# Patient Record
Sex: Male | Born: 1955 | Race: White | Hispanic: No | Marital: Single | Smoking: Current every day smoker
Health system: Southern US, Community
[De-identification: ages and names within clinical notes are randomized; demographics above are authoritative.]

## PROBLEM LIST (undated history)

## (undated) DIAGNOSIS — K529 Noninfective gastroenteritis and colitis, unspecified: Secondary | ICD-10-CM

## (undated) DIAGNOSIS — I10 Essential (primary) hypertension: Secondary | ICD-10-CM

## (undated) DIAGNOSIS — B1920 Unspecified viral hepatitis C without hepatic coma: Secondary | ICD-10-CM

## (undated) DIAGNOSIS — G8929 Other chronic pain: Secondary | ICD-10-CM

## (undated) DIAGNOSIS — C801 Malignant (primary) neoplasm, unspecified: Secondary | ICD-10-CM

## (undated) DIAGNOSIS — M549 Dorsalgia, unspecified: Secondary | ICD-10-CM

## (undated) DIAGNOSIS — G2581 Restless legs syndrome: Secondary | ICD-10-CM

## (undated) HISTORY — DX: Restless legs syndrome: G25.81

## (undated) HISTORY — PX: LAMINECTOMY: SHX219

## (undated) HISTORY — PX: LUMBAR DISC SURGERY: SHX700

## (undated) HISTORY — DX: Other chronic pain: G89.29

## (undated) HISTORY — PX: ROTATOR CUFF REPAIR: SHX139

## (undated) HISTORY — PX: KNEE SURGERY: SHX244

## (undated) HISTORY — DX: Dorsalgia, unspecified: M54.9

## (undated) HISTORY — DX: Noninfective gastroenteritis and colitis, unspecified: K52.9

---

## 2001-10-22 ENCOUNTER — Emergency Department (HOSPITAL_COMMUNITY): Admission: EM | Admit: 2001-10-22 | Discharge: 2001-10-23 | Payer: Self-pay | Admitting: Emergency Medicine

## 2002-11-27 ENCOUNTER — Emergency Department (HOSPITAL_COMMUNITY): Admission: EM | Admit: 2002-11-27 | Discharge: 2002-11-27 | Payer: Self-pay | Admitting: Emergency Medicine

## 2002-11-27 ENCOUNTER — Encounter: Payer: Self-pay | Admitting: Emergency Medicine

## 2002-12-19 ENCOUNTER — Emergency Department (HOSPITAL_COMMUNITY): Admission: EM | Admit: 2002-12-19 | Discharge: 2002-12-19 | Payer: Self-pay | Admitting: Emergency Medicine

## 2003-09-02 ENCOUNTER — Emergency Department (HOSPITAL_COMMUNITY): Admission: EM | Admit: 2003-09-02 | Discharge: 2003-09-02 | Payer: Self-pay | Admitting: *Deleted

## 2003-11-04 ENCOUNTER — Emergency Department (HOSPITAL_COMMUNITY): Admission: EM | Admit: 2003-11-04 | Discharge: 2003-11-04 | Payer: Self-pay | Admitting: Emergency Medicine

## 2004-02-16 ENCOUNTER — Emergency Department (HOSPITAL_COMMUNITY): Admission: EM | Admit: 2004-02-16 | Discharge: 2004-02-16 | Payer: Self-pay | Admitting: Emergency Medicine

## 2004-08-06 ENCOUNTER — Emergency Department (HOSPITAL_COMMUNITY): Admission: EM | Admit: 2004-08-06 | Discharge: 2004-08-06 | Payer: Self-pay | Admitting: Emergency Medicine

## 2004-10-05 ENCOUNTER — Ambulatory Visit (HOSPITAL_COMMUNITY): Admission: RE | Admit: 2004-10-05 | Discharge: 2004-10-05 | Payer: Self-pay | Admitting: Neurosurgery

## 2005-04-15 ENCOUNTER — Ambulatory Visit (HOSPITAL_COMMUNITY): Admission: RE | Admit: 2005-04-15 | Discharge: 2005-04-15 | Payer: Self-pay | Admitting: Neurosurgery

## 2005-06-26 ENCOUNTER — Emergency Department (HOSPITAL_COMMUNITY): Admission: EM | Admit: 2005-06-26 | Discharge: 2005-06-26 | Payer: Self-pay | Admitting: Emergency Medicine

## 2005-08-05 ENCOUNTER — Emergency Department (HOSPITAL_COMMUNITY): Admission: EM | Admit: 2005-08-05 | Discharge: 2005-08-05 | Payer: Self-pay | Admitting: Emergency Medicine

## 2005-08-29 ENCOUNTER — Emergency Department (HOSPITAL_COMMUNITY): Admission: EM | Admit: 2005-08-29 | Discharge: 2005-08-29 | Payer: Self-pay | Admitting: Emergency Medicine

## 2006-02-07 ENCOUNTER — Emergency Department (HOSPITAL_COMMUNITY): Admission: EM | Admit: 2006-02-07 | Discharge: 2006-02-07 | Payer: Self-pay | Admitting: Family Medicine

## 2006-02-13 ENCOUNTER — Emergency Department (HOSPITAL_COMMUNITY): Admission: EM | Admit: 2006-02-13 | Discharge: 2006-02-13 | Payer: Self-pay | Admitting: Emergency Medicine

## 2006-07-22 ENCOUNTER — Emergency Department (HOSPITAL_COMMUNITY): Admission: EM | Admit: 2006-07-22 | Discharge: 2006-07-22 | Payer: Self-pay | Admitting: Emergency Medicine

## 2006-07-29 ENCOUNTER — Ambulatory Visit (HOSPITAL_COMMUNITY): Admission: RE | Admit: 2006-07-29 | Discharge: 2006-07-29 | Payer: Self-pay | Admitting: Neurosurgery

## 2006-07-29 ENCOUNTER — Encounter: Payer: Self-pay | Admitting: Vascular Surgery

## 2006-07-29 ENCOUNTER — Emergency Department (HOSPITAL_COMMUNITY): Admission: EM | Admit: 2006-07-29 | Discharge: 2006-07-29 | Payer: Self-pay | Admitting: Emergency Medicine

## 2006-10-31 ENCOUNTER — Ambulatory Visit (HOSPITAL_COMMUNITY): Admission: RE | Admit: 2006-10-31 | Discharge: 2006-10-31 | Payer: Self-pay | Admitting: Neurosurgery

## 2006-11-21 ENCOUNTER — Emergency Department (HOSPITAL_COMMUNITY): Admission: EM | Admit: 2006-11-21 | Discharge: 2006-11-21 | Payer: Self-pay | Admitting: Emergency Medicine

## 2007-01-08 ENCOUNTER — Emergency Department (HOSPITAL_COMMUNITY): Admission: EM | Admit: 2007-01-08 | Discharge: 2007-01-08 | Payer: Self-pay | Admitting: Emergency Medicine

## 2007-08-07 ENCOUNTER — Emergency Department (HOSPITAL_COMMUNITY): Admission: EM | Admit: 2007-08-07 | Discharge: 2007-08-07 | Payer: Self-pay | Admitting: *Deleted

## 2007-08-14 ENCOUNTER — Emergency Department (HOSPITAL_COMMUNITY): Admission: EM | Admit: 2007-08-14 | Discharge: 2007-08-14 | Payer: Self-pay | Admitting: *Deleted

## 2008-07-15 ENCOUNTER — Ambulatory Visit (HOSPITAL_COMMUNITY): Admission: RE | Admit: 2008-07-15 | Discharge: 2008-07-15 | Payer: Self-pay | Admitting: Cardiovascular Disease

## 2008-07-16 ENCOUNTER — Ambulatory Visit (HOSPITAL_COMMUNITY): Admission: RE | Admit: 2008-07-16 | Discharge: 2008-07-16 | Payer: Self-pay | Admitting: Cardiovascular Disease

## 2008-07-30 ENCOUNTER — Inpatient Hospital Stay (HOSPITAL_COMMUNITY): Admission: RE | Admit: 2008-07-30 | Discharge: 2008-07-31 | Payer: Self-pay | Admitting: Neurosurgery

## 2008-08-25 ENCOUNTER — Ambulatory Visit (HOSPITAL_COMMUNITY): Admission: RE | Admit: 2008-08-25 | Discharge: 2008-08-25 | Payer: Self-pay | Admitting: Neurosurgery

## 2008-10-04 ENCOUNTER — Encounter: Admission: RE | Admit: 2008-10-04 | Discharge: 2008-10-04 | Payer: Self-pay | Admitting: Neurosurgery

## 2008-12-23 ENCOUNTER — Encounter (INDEPENDENT_AMBULATORY_CARE_PROVIDER_SITE_OTHER): Payer: Self-pay | Admitting: Emergency Medicine

## 2008-12-23 ENCOUNTER — Emergency Department (HOSPITAL_COMMUNITY): Admission: EM | Admit: 2008-12-23 | Discharge: 2008-12-23 | Payer: Self-pay | Admitting: Emergency Medicine

## 2008-12-23 ENCOUNTER — Ambulatory Visit: Payer: Self-pay | Admitting: Vascular Surgery

## 2008-12-27 ENCOUNTER — Emergency Department (HOSPITAL_COMMUNITY): Admission: EM | Admit: 2008-12-27 | Discharge: 2008-12-27 | Payer: Self-pay | Admitting: Emergency Medicine

## 2009-02-09 ENCOUNTER — Inpatient Hospital Stay (HOSPITAL_COMMUNITY): Admission: EM | Admit: 2009-02-09 | Discharge: 2009-02-17 | Payer: Self-pay | Admitting: Emergency Medicine

## 2009-03-05 ENCOUNTER — Encounter: Admission: RE | Admit: 2009-03-05 | Discharge: 2009-03-05 | Payer: Self-pay | Admitting: Orthopaedic Surgery

## 2009-04-02 ENCOUNTER — Ambulatory Visit (HOSPITAL_COMMUNITY): Admission: RE | Admit: 2009-04-02 | Discharge: 2009-04-02 | Payer: Self-pay | Admitting: Neurosurgery

## 2009-05-02 ENCOUNTER — Encounter
Admission: RE | Admit: 2009-05-02 | Discharge: 2009-07-31 | Payer: Self-pay | Admitting: Physical Medicine & Rehabilitation

## 2009-06-06 ENCOUNTER — Ambulatory Visit: Payer: Self-pay | Admitting: Physical Medicine & Rehabilitation

## 2009-06-23 ENCOUNTER — Ambulatory Visit: Payer: Self-pay | Admitting: Physical Medicine & Rehabilitation

## 2009-06-26 ENCOUNTER — Encounter: Admission: RE | Admit: 2009-06-26 | Discharge: 2009-06-26 | Payer: Self-pay | Admitting: Orthopedic Surgery

## 2009-07-11 ENCOUNTER — Ambulatory Visit: Payer: Self-pay | Admitting: Physical Medicine & Rehabilitation

## 2009-07-16 ENCOUNTER — Ambulatory Visit: Payer: Self-pay | Admitting: Vascular Surgery

## 2009-07-21 ENCOUNTER — Ambulatory Visit: Payer: Self-pay | Admitting: Internal Medicine

## 2009-07-21 DIAGNOSIS — R51 Headache: Secondary | ICD-10-CM

## 2009-07-21 DIAGNOSIS — R519 Headache, unspecified: Secondary | ICD-10-CM | POA: Insufficient documentation

## 2009-07-21 DIAGNOSIS — I10 Essential (primary) hypertension: Secondary | ICD-10-CM | POA: Insufficient documentation

## 2009-07-21 DIAGNOSIS — M545 Low back pain: Secondary | ICD-10-CM | POA: Insufficient documentation

## 2009-07-31 ENCOUNTER — Ambulatory Visit: Payer: Self-pay | Admitting: Physical Medicine & Rehabilitation

## 2009-08-20 ENCOUNTER — Encounter: Payer: Self-pay | Admitting: Internal Medicine

## 2009-08-20 ENCOUNTER — Observation Stay (HOSPITAL_COMMUNITY): Admission: EM | Admit: 2009-08-20 | Discharge: 2009-08-20 | Payer: Self-pay | Admitting: Emergency Medicine

## 2009-08-22 ENCOUNTER — Ambulatory Visit: Payer: Self-pay | Admitting: Internal Medicine

## 2009-08-22 DIAGNOSIS — F17200 Nicotine dependence, unspecified, uncomplicated: Secondary | ICD-10-CM | POA: Insufficient documentation

## 2009-08-22 LAB — CONVERTED CEMR LAB
AST: 24 units/L (ref 0–37)
Albumin: 4.5 g/dL (ref 3.5–5.2)
BUN: 10 mg/dL (ref 6–23)
Barbiturate Quant, Ur: NEGATIVE
Basophils Relative: 0.1 % (ref 0.0–3.0)
Benzodiazepines.: NEGATIVE
Bilirubin Urine: NEGATIVE
Bilirubin, Direct: 0.1 mg/dL (ref 0.0–0.3)
Calcium: 9.9 mg/dL (ref 8.4–10.5)
Eosinophils Relative: 1.4 % (ref 0.0–5.0)
HCT: 46.6 % (ref 39.0–52.0)
Hemoglobin: 15.3 g/dL (ref 13.0–17.0)
Ketones, ur: NEGATIVE mg/dL
Leukocytes, UA: NEGATIVE
Lymphs Abs: 2.7 10*3/uL (ref 0.7–4.0)
MCV: 98.5 fL (ref 78.0–100.0)
Marijuana Metabolite: NEGATIVE
Methadone: NEGATIVE
Monocytes Absolute: 0.6 10*3/uL (ref 0.1–1.0)
Monocytes Relative: 8.3 % (ref 3.0–12.0)
Neutro Abs: 3.8 10*3/uL (ref 1.4–7.7)
Platelets: 237 10*3/uL (ref 150.0–400.0)
Potassium: 4.3 meq/L (ref 3.5–5.3)
Propoxyphene: NEGATIVE
RBC: 4.73 M/uL (ref 4.22–5.81)
Sodium: 139 meq/L (ref 135–145)
Specific Gravity, Urine: 1.02 (ref 1.000–1.030)
Total Bilirubin: 0.4 mg/dL (ref 0.3–1.2)
Total Protein, Urine: NEGATIVE mg/dL
Urine Glucose: NEGATIVE mg/dL
WBC: 7.2 10*3/uL (ref 4.5–10.5)
pH: 5 (ref 5.0–8.0)

## 2009-08-25 ENCOUNTER — Telehealth: Payer: Self-pay | Admitting: Internal Medicine

## 2009-08-26 ENCOUNTER — Telehealth: Payer: Self-pay | Admitting: Internal Medicine

## 2009-08-28 ENCOUNTER — Ambulatory Visit: Payer: Self-pay | Admitting: Internal Medicine

## 2009-08-29 ENCOUNTER — Telehealth: Payer: Self-pay | Admitting: Internal Medicine

## 2009-09-01 ENCOUNTER — Telehealth: Payer: Self-pay | Admitting: Internal Medicine

## 2009-09-11 ENCOUNTER — Ambulatory Visit (HOSPITAL_COMMUNITY): Admission: RE | Admit: 2009-09-11 | Discharge: 2009-09-11 | Payer: Self-pay | Admitting: Orthopedic Surgery

## 2009-09-23 ENCOUNTER — Encounter: Admission: RE | Admit: 2009-09-23 | Discharge: 2009-09-23 | Payer: Self-pay | Admitting: Orthopedic Surgery

## 2009-10-09 ENCOUNTER — Ambulatory Visit (HOSPITAL_COMMUNITY): Admission: RE | Admit: 2009-10-09 | Discharge: 2009-10-09 | Payer: Self-pay | Admitting: Orthopedic Surgery

## 2009-10-09 ENCOUNTER — Encounter (INDEPENDENT_AMBULATORY_CARE_PROVIDER_SITE_OTHER): Payer: Self-pay | Admitting: Orthopedic Surgery

## 2009-10-09 ENCOUNTER — Ambulatory Visit: Payer: Self-pay | Admitting: Vascular Surgery

## 2009-11-12 ENCOUNTER — Ambulatory Visit (HOSPITAL_COMMUNITY): Admission: RE | Admit: 2009-11-12 | Discharge: 2009-11-12 | Payer: Self-pay | Admitting: Orthopedic Surgery

## 2009-12-31 ENCOUNTER — Ambulatory Visit (HOSPITAL_COMMUNITY): Admission: RE | Admit: 2009-12-31 | Discharge: 2009-12-31 | Payer: Self-pay | Admitting: Orthopedic Surgery

## 2010-01-23 ENCOUNTER — Emergency Department (HOSPITAL_COMMUNITY): Admission: EM | Admit: 2010-01-23 | Discharge: 2010-01-23 | Payer: Self-pay | Admitting: Emergency Medicine

## 2010-03-26 ENCOUNTER — Ambulatory Visit (HOSPITAL_COMMUNITY): Admission: RE | Admit: 2010-03-26 | Discharge: 2010-03-26 | Payer: Self-pay | Admitting: Orthopedic Surgery

## 2010-08-30 ENCOUNTER — Encounter: Payer: Self-pay | Admitting: Neurosurgery

## 2010-08-31 ENCOUNTER — Encounter: Payer: Self-pay | Admitting: Neurosurgery

## 2010-09-08 NOTE — Progress Notes (Signed)
Summary: Pain rx  Phone Note Call from Patient Call back at Home Phone [phone]   Summary of Call: Patient left message on triage that he needs prescription for pain. Per the patient he is waiting on call back from Dr. Vear Clock regarding a device for his back, but doesn't have any idea when he will get it. Per the patient he cannot afford to keep going to the hopital (already owes a great deal of money to them). Please advise. Initial call taken by: Lucious Groves,  August 29, 2009 11:47 AM  Follow-up for Phone Call        no more narcotics, that was only short term, wait to hear from Dr. Vear Clock Follow-up by: Etta Grandchild MD,  August 29, 2009 4:45 PM  Additional Follow-up for Phone Call Additional follow up Details #1::        Patient notified and gave him the # to call that MD. Additional Follow-up by: Lucious Groves,  August 29, 2009 4:49 PM

## 2010-09-08 NOTE — Progress Notes (Signed)
Summary: refill request hydrocodone  Phone Note Refill Request Message from:  Fax from Pharmacy on August 26, 2009 10:56 AM  Refills Requested: Medication #1:  LORTAB 7.5 7.5-500 MG TABS One by mouth three times a day for pain   Dosage confirmed as above?Dosage Confirmed   Supply Requested: 1 month   Last Refilled: 08/22/2009   Notes: given #25  Is this ok to refill for pt?  Initial call taken by: Rock Nephew CMA,  August 26, 2009 10:57 AM  Follow-up for Phone Call        no Follow-up by: Etta Grandchild MD,  August 26, 2009 10:59 AM  Additional Follow-up for Phone Call Additional follow up Details #1::        Pt informed, he has office visit tomorrow to discuss tomorrow Additional Follow-up by: Lamar Sprinkles, CMA,  August 26, 2009 4:23 PM

## 2010-09-08 NOTE — Progress Notes (Signed)
Summary: PAIN REFERRAL  Phone Note Call from Patient Call back at Home Phone [phone]   Summary of Call: Patient is requesting a referral. At last office visit pt was told to call [facility] and pt says they didn't want to see him. He would like to be referred to someone regarding the pain in his legs.  Initial call taken by: Lamar Sprinkles, CMA,  August 25, 2009 11:59 AM  Follow-up for Phone Call        done Follow-up by: Etta Grandchild MD,  August 25, 2009 12:02 PM

## 2010-09-08 NOTE — Assessment & Plan Note (Signed)
Summary: elev bp/offered sda, requested 1/14/cd   Vital Signs:  Patient profile:   55 year old male Height:      72 inches O2 Sat:      98 % on Room air Temp:     97.3 degrees F oral Pulse rate:   79 / minute Pulse rhythm:   regular Resp:     16 per minute BP sitting:   130 / 94  (left arm) Cuff size:   large  Vitals Entered By: Rock Nephew CMA (August 22, 2009 1:53 PM)  O2 Flow:  Room air  Primary Care Provider:  Etta Grandchild MD   History of Present Illness: He returns 2 days after a fall that worsened his low back pain. He was seen in the ER and given IV and oral opioids and referred to [facility] and Dr. Despina Hick. He wants a higher dose of hydrocodone today. He also wants to start treating his high blood pressure.  Back Pain History:      The patient's back pain started approximately 08/20/2009.  The pain is located in the lower back region and does not radiate below the knees.  He states this is not work related.  On a scale of 1-10, he describes the pain as a 5.  He states that he has had a prior history of back pain.  The patient has not had any recent physical therapy for his back pain.  The following makes the back pain better: rest.  The following makes the back pain worse: movement.        Description of injury in patient's own words:  fell out of bed.    Critical Exclusionary Diagnosis Criteria (CEDC) for Back Pain:      The patient gives a history of previous trauma.  He notes a prior history of spinal surgery.  There are no symptoms to suggest infection or cauda equina.  Cancer risk factors include age >50 yrs with new back pain.  Possible psychosocial cause(s) for his back pain include failure of previous therapy, history of substance abuse, and disability compensation-[place] or pending.  Other positive CEDC factors include "failed back syndrome".    Hypertension History:      He complains of headache, but denies chest pain, palpitations, dyspnea with  exertion, orthopnea, peripheral edema, visual symptoms, neurologic problems, syncope, and side effects from treatment.        Positive major cardiovascular risk factors include male age 59 years old or older, hypertension, and Cheema tobacco user.        Further assessment for target organ damage reveals no history of ASHD, cardiac end-organ damage (CHF/LVH), stroke/TIA, peripheral vascular disease, renal insufficiency, or hypertensive retinopathy.      Preventive Screening-Counseling & Management  Alcohol-Tobacco     Alcohol drinks/day: 0     Smoking Status: Peoples     Smoking Cessation Counseling: yes     Smoke Cessation Stage: precontemplative     Packs/Day: 1.0     Year Started: 1980     Pack years: 30  Hep-HIV-STD-Contraception     Hepatitis Risk: no risk noted     HIV Risk: no risk noted     STD Risk: no risk noted      Drug Use:  no.        Blood Transfusions:  no.    [place] Medications (verified): 1)  Cymbalta 20 Mg Cpep (Duloxetine Hcl) .Marland Kitchen.. 1 Once Daily 2)  Aspirin 81 Mg Tbec (Aspirin) .Marland KitchenMarland KitchenMarland Kitchen  1 Once Daily 3)  Otc Sinus Decongestant With Tylenol .Marland Kitchen.. 1 Once Daily As Needed 4)  Methocarbamol 500 Mg Tabs (Methocarbamol) .Marland Kitchen.. 1 Three Times A Day As Needed 5)  Hydrocodone-Acetaminophen 5-500 Mg Tabs (Hydrocodone-Acetaminophen) .Marland Kitchen.. 1 Every 4-6 Hours Prn  Allergies (verified): 1)  ! Codeine 2)  ! Flexeril  Past History:  Past Medical History: Reviewed history from 07/21/2009 and no changes required. Headache Hypertension Low back pain  Past Surgical History: Reviewed history from 07/21/2009 and no changes required. Total knee replacement Lumbar laminectomy Tonsillectomy  Social History: Reviewed history from 07/21/2009 and no changes required. Single/Disabled [place]ange Smoker Alcohol use-no Drug use-no Regular exercise-no  Review of Systems  The patient denies anorexia, fever, weight loss, weight gain, abdominal pain, hematuria, incontinence, muscle  weakness, and suspicious skin lesions.    Physical Exam  General:  alert, well-developed, well-nourished, well-hydrated, and unable to place on exam table, in a wheel chair.   Head:  normocephalic and atraumatic.   Mouth:  good dentition, no gingival abnormalities, no dental plaque, pharynx pink and moist, no erythema, and no exudates.   Neck:  supple, full ROM, and no masses.   Lungs:  Normal respiratory effort, chest expands symmetrically. Lungs are clear to auscultation, no crackles or wheezes. Heart:  Normal rate and regular rhythm. S1 and S2 normal without gallop, murmur, click, rub or other extra sounds. Abdomen:  Bowel sounds positive,abdomen soft and non-tender without masses, organomegaly or hernias noted. Msk:  normal ROM, no joint tenderness, no joint swelling, no joint warmth, no redness over joints, no joint deformities, no joint instability, and no crepitation.   Pulses:  R and L carotid,radial,femoral,dorsalis pedis and posterior tibial pulses are full and equal bilaterally Extremities:  No clubbing, cyanosis, edema, or deformity noted with normal full range of motion of all joints.   Neurologic:  No cranial nerve deficits noted. Station and gait are normal. Plantar reflexes are down-going bilaterally. DTRs are symmetrical throughout. Sensory, motor and coordinative functions appear intact. Skin:  turgor normal, color normal, no rashes, no suspicious lesions, no ecchymoses, no petechiae, and tattoo(s).   Cervical Nodes:  no anterior cervical adenopathy and no posterior cervical adenopathy.   Psych:  Oriented X3, memory intact for recent and remote, normally interactive, good eye contact, not anxious appearing, not depressed appearing, not suicidal, and agitated.    Low Back Pain Physical Exam:    Inspection-deformity:     No    Palpation-spinal tenderness:   No    Motor Exam/Strength:         Left Ankle Dorsiflexion (L5,L4):     normal       Left Great Toe Dorsiflexion (L5,L4):      normal       Left Heel Walk (L5,some L4):     normal       Left Toe Walk-calf (S1):       normal       Right Ankle Dorsiflexion (L5,L4):     normal       Right Great Toe Dorsiflexion (L5,L4):       normal       Right Heel Walk (L5,some L4):     normal       Right Toe Walk-calf (S1):       normal    Sensory Exam/Pinprick:        Left Medial Foot (L4):   normal       Left Dorsal Foot (L5):   normal  Right Medial Foot (L4):   normal       Right Dorsal Foot (L5):   normal       Right Lateral Foot (S1):   normal    Reflexes:        Left Knee Jerk (L4):     normal       Left Ankle Reflex (S1):   normal       Right Knee Jerk:     normal       Right Ankle Reflex (S1):   normal    Straight Leg Raise (SLR):       Left Straight Leg Raise (SLR):   negative       Right Straight Leg Raise (SLR):   negative   Complete Medication List: 1)  Cymbalta 20 Mg Cpep (Duloxetine hcl) .Marland Kitchen.. 1 once daily 2)  Aspirin 81 Mg Tbec (Aspirin) .Marland Kitchen.. 1 once daily 3)  Methocarbamol 500 Mg Tabs (Methocarbamol) .Marland Kitchen.. 1 three times a day as needed 4)  Lortab 7.5 7.5-500 Mg Tabs (Hydrocodone-acetaminophen) .... One by mouth three times a day for pain 5)  Micardis 40 Mg Tabs (Telmisartan) .... One by mouth once daily for high blood pressure  Other Orders: Venipuncture (16109) TLB-CBC Platelet - w/Differential (85025-CBCD) TLB-Hepatic/Liver Function Pnl (80076-HEPATIC) TLB-BMP (Basic Metabolic Panel-BMET) (80048-METABOL) TLB-TSH (Thyroid Stimulating Hormone) (84443-TSH) TLB-Udip w/ Micro (81001-URINE) T-Drug Screen-Urine, (single) (60454-09811)  Hypertension Assessment/Plan:      The patient's hypertensive risk group is category B: At least one risk factor (excluding diabetes) with no target organ damage.  Today's blood pressure is 130/94.  His blood pressure goal is < 140/90.  Patient Instructions: 1)  Please schedule a follow-up appointment in 1 month. 2)  Tobacco is very bad for your health and your  loved ones! You Should stop smoking!. 3)  Stop Smoking Tips: Choose a Quit date. Cut down before the Quit date. decide what you will do as a substitute when you feel the urge to smoke(gum,toothpick,exercise). 4)  Check your Blood Pressure regularly. If it is above 140/90: you should make an appointment. Prescriptions: MICARDIS 40 MG TABS (TELMISARTAN) One by mouth once daily for high blood pressure  #28 x 0   Entered and Authorized by:   Etta Grandchild MD   Signed by:   Etta Grandchild MD on 08/22/2009   Method used:   Samples Given   RxID:   [id number] LORTAB 7.5 7.5-500 MG TABS (HYDROCODONE-ACETAMINOPHEN) One by mouth three times a day for pain  #25 x 0   Entered and Authorized by:   Etta Grandchild MD   Signed by:   Etta Grandchild MD on 08/22/2009   Method used:   Print then Give to Patient   RxID:   [id number]   Appended Document: elev bp/offered sda, requested 1/14/cd     Impression & Recommendations:  Problem # 1:  LOW BACK PAIN (ICD-724.2) Assessment Deteriorated  His updated medication list for this problem includes:    Aspirin 81 Mg Tbec (Aspirin) .Marland Kitchen... 1 once daily    Methocarbamol 500 Mg Tabs (Methocarbamol) .Marland Kitchen... 1 three times a day as needed    Lortab 7.5 7.5-500 Mg Tabs (Hydrocodone-acetaminophen) ..... One by mouth three times a day for pain  Discussed use of moist heat or ice, modified activities, medications, and stretching/strengthening exercises. Back care instructions given. To be seen in 2 weeks if no improvement; sooner if worsening of symptoms.   Problem # 2:  HYPERTENSION (ICD-401.9) Assessment: Deteriorated  His updated medication list for this problem includes:    Micardis 40 Mg Tabs (Telmisartan) ..... One by mouth once daily for high blood pressure  Prior BP: 130/94 (08/22/2009)  Prior 10 Yr Risk Heart Disease: Not enough information (07/21/2009)  Orders: Tobacco use cessation intermediate 3-10 minutes (99406)  Problem # 3:   HEADACHE (ICD-784.0) Assessment: Unchanged  His updated medication list for this problem includes:    Aspirin 81 Mg Tbec (Aspirin) .Marland Kitchen... 1 once daily    Lortab 7.5 7.5-500 Mg Tabs (Hydrocodone-acetaminophen) ..... One by mouth three times a day for pain  Headache diary reviewed.  Problem # 4:  TOBACCO USE (ICD-305.1) Assessment: Unchanged  Encouraged smoking cessation and discussed different methods for smoking cessation.   Orders: Tobacco use cessation intermediate 3-10 minutes (99406)  Complete Medication List: 1)  Cymbalta 20 Mg Cpep (Duloxetine hcl) .Marland Kitchen.. 1 once daily 2)  Aspirin 81 Mg Tbec (Aspirin) .Marland Kitchen.. 1 once daily 3)  Methocarbamol 500 Mg Tabs (Methocarbamol) .Marland Kitchen.. 1 three times a day as needed 4)  Lortab 7.5 7.5-500 Mg Tabs (Hydrocodone-acetaminophen) .... One by mouth three times a day for pain 5)  Micardis 40 Mg Tabs (Telmisartan) .... One by mouth once daily for high blood pressure    Appended Document: Orders Update    Clinical Lists Changes  Orders: Added new Test order of T- * Misc. Laboratory test [id number]) - Signed

## 2010-09-08 NOTE — Assessment & Plan Note (Signed)
Summary: [name]   Vital Signs:  Patient profile:   55 year old male Height:      72 inches O2 Sat:      95 % on Room air Temp:     98.3 degrees F oral Pulse rate:   83 / minute Pulse rhythm:   regular Resp:     16 per minute BP sitting:   140 / 90  (left arm) Cuff size:   large  Vitals Entered By: Samuel Massey CMA (August 28, 2009 2:25 PM)  O2 Flow:  Room air CC: discuss BP medication and back pain Is Patient Diabetic? No Pain Assessment Patient in pain? yes     Location: back   Primary Care Provider:  Etta Grandchild MD  CC:  discuss BP medication and back pain.  History of Present Illness:  Hypertension Follow-Up      This is a 55 year old man who presents for Hypertension follow-up.  The patient denies lightheadedness, urinary frequency, headaches, edema, and rash.  The patient denies the following associated symptoms: chest pain, chest pressure, exercise intolerance, dyspnea, palpitations, syncope, leg edema, and pedal edema.  Compliance with medications (by patient report) has been near 100%.  The patient reports that dietary compliance has been poor.  The patient reports no exercise.  Adjunctive measures currently used by the patient include salt restriction and relaxation.    Preventive Screening-Counseling & Management  Alcohol-Tobacco     Smoking Cessation Counseling: yes  Allergies (verified): 1)  ! Codeine 2)  ! Flexeril  Past History:  Past Medical History: Reviewed history from 07/21/2009 and no changes required. Headache Hypertension Low back pain  Past Surgical History: Reviewed history from 07/21/2009 and no changes required. Total knee replacement Lumbar laminectomy Tonsillectomy  Social History: Reviewed history from 07/21/2009 and no changes required. Single/Disabled Samuel Massey Alcohol use-no Drug use-no Regular exercise-no  Review of Systems  The patient denies chest pain, abdominal pain,  hematuria, and incontinence.    Physical Exam  General:  alert, well-developed, well-nourished, well-hydrated, and unable to place on exam table, in a wheel chair.   Mouth:  good dentition, no gingival abnormalities, no dental plaque, pharynx pink and moist, no erythema, and no exudates.   Neck:  supple, full ROM, and no masses.   Lungs:  Normal respiratory effort, chest expands symmetrically. Lungs are clear to auscultation, no crackles or wheezes. Heart:  Normal rate and regular rhythm. S1 and S2 normal without gallop, murmur, click, rub or other extra sounds. Abdomen:  Bowel sounds positive,abdomen soft and non-tender without masses, organomegaly or hernias noted. Msk:  normal ROM, no joint tenderness, no joint swelling, no joint warmth, no redness over joints, no joint deformities, no joint instability, and no crepitation.   Extremities:  No clubbing, cyanosis, edema, or deformity noted with normal full range of motion of all joints.   Neurologic:  No cranial nerve deficits noted. Station and gait are normal. Plantar reflexes are down-going bilaterally. DTRs are symmetrical throughout. Sensory, motor and coordinative functions appear intact. Skin:  turgor normal, color normal, no rashes, no suspicious lesions, no ecchymoses, no petechiae, and tattoo(s).   Psych:  Oriented X3, memory intact for recent and remote, normally interactive, good eye contact, not anxious appearing, not depressed appearing, not suicidal, and agitated.     Impression & Recommendations:  Problem # 1:  TOBACCO USE (ICD-305.1) Assessment Unchanged  Encouraged smoking cessation and discussed different methods for smoking cessation.   Orders:  Tobacco use cessation intermediate 3-10 minutes (16109)  Problem # 2:  HYPERTENSION (ICD-401.9) Assessment: Improved  His updated medication list for this problem includes:    Micardis 40 Mg Tabs (Telmisartan) ..... One by mouth once daily for high blood pressure  BP  today: 140/90 Prior BP: 130/94 (08/22/2009)  Prior 10 Yr Risk Heart Disease: Not enough information (07/21/2009)  Labs Reviewed: K+: 4.3 (08/22/2009) Creat: : 0.76 (08/22/2009)     Orders: Tobacco use cessation intermediate 3-10 minutes (99406)  Complete Medication List: 1)  Cymbalta 20 Mg Cpep (Duloxetine hcl) .Marland Kitchen.. 1 once daily 2)  Aspirin 81 Mg Tbec (Aspirin) .Marland Kitchen.. 1 once daily 3)  Methocarbamol 500 Mg Tabs (Methocarbamol) .Marland Kitchen.. 1 three times a day as needed 4)  Lortab 7.5 7.5-500 Mg Tabs (Hydrocodone-acetaminophen) .... One by mouth three times a day for pain 5)  Micardis 40 Mg Tabs (Telmisartan) .... One by mouth once daily for high blood pressure  Other Orders: Pain Clinic Referral (Pain)  Patient Instructions: 1)  Please schedule a follow-up appointment in 1 month. 2)  Tobacco is very bad for your health and your loved ones! You Should stop smoking!. 3)  Stop Smoking Tips: Choose a Quit date. Cut down before the Quit date. decide what you will do as a substitute when you feel the urge to smoke(gum,toothpick,exercise). 4)  Check your Blood Pressure regularly. If it is above 140/90: you should make an appointment.

## 2010-09-08 NOTE — Progress Notes (Signed)
Summary: ANOTHER REFERRAL  Phone Note Call from Patient   Summary of Call: Pt left vm upset that Dr Vear Clock will not see him. He would like referral to another Dr regarding his back. Pt was d/c'd from [facility] pain management and Dr Vear Clock will not see pt due to this fact.  Initial call taken by: Lamar Sprinkles, CMA,  September 01, 2009 11:41 AM  Follow-up for Phone Call        ok Follow-up by: Etta Grandchild MD,  September 01, 2009 12:39 PM

## 2010-09-15 ENCOUNTER — Ambulatory Visit: Payer: Self-pay | Admitting: Family Medicine

## 2010-09-29 ENCOUNTER — Ambulatory Visit: Payer: Self-pay | Admitting: Family Medicine

## 2010-10-22 LAB — CBC
HCT: 47.6 % (ref 39.0–52.0)
Hemoglobin: 16.6 g/dL (ref 13.0–17.0)
MCHC: 34.9 g/dL (ref 30.0–36.0)
WBC: 7.9 10*3/uL (ref 4.0–10.5)

## 2010-10-22 LAB — COMPREHENSIVE METABOLIC PANEL
ALT: 20 U/L (ref 0–53)
AST: 27 U/L (ref 0–37)
Alkaline Phosphatase: 74 U/L (ref 39–117)
CO2: 29 mEq/L (ref 19–32)
Calcium: 9.8 mg/dL (ref 8.4–10.5)
GFR calc Af Amer: >60 mL/min (ref >60)
GFR calc non Af Amer: >60 mL/min (ref >60)
Glucose, Bld: 94 mg/dL (ref 70–99)
Potassium: 4.6 mEq/L (ref 3.5–5.1)
Sodium: 140 mEq/L (ref 135–145)
Total Protein: 7.5 g/dL (ref 6.0–8.3)

## 2010-10-22 LAB — URINALYSIS, ROUTINE W REFLEX MICROSCOPIC
Bilirubin Urine: NEGATIVE
Glucose, UA: NEGATIVE mg/dL
Hgb urine dipstick: NEGATIVE
Specific Gravity, Urine: 1.025 (ref 1.005–1.030)

## 2010-11-01 LAB — CBC
HCT: 44.4 % (ref 39.0–52.0)
Hemoglobin: 15.1 g/dL (ref 13.0–17.0)
MCHC: 34 g/dL (ref 30.0–36.0)
MCV: 97.4 fL (ref 78.0–100.0)
RBC: 4.56 MIL/uL (ref 4.22–5.81)
WBC: 10.9 10*3/uL — ABNORMAL HIGH (ref 4.0–10.5)

## 2010-11-01 LAB — COMPREHENSIVE METABOLIC PANEL
AST: 29 U/L (ref 0–37)
BUN: 4 mg/dL — ABNORMAL LOW (ref 6–23)
CO2: 25 mEq/L (ref 19–32)
Chloride: 106 mEq/L (ref 96–112)
Creatinine, Ser: 0.87 mg/dL (ref 0.4–1.5)
GFR calc non Af Amer: >60 mL/min (ref >60)
Glucose, Bld: 97 mg/dL (ref 70–99)
Total Bilirubin: 0.7 mg/dL (ref 0.3–1.2)

## 2010-11-01 LAB — URINALYSIS, ROUTINE W REFLEX MICROSCOPIC
Protein, ur: NEGATIVE mg/dL
Specific Gravity, Urine: 1.024 (ref 1.005–1.030)
Urobilinogen, UA: 0.2 mg/dL (ref 0.0–1.0)

## 2010-11-14 LAB — BUN: BUN: 3 mg/dL — ABNORMAL LOW (ref 6–23)

## 2010-11-14 LAB — CREATININE, SERUM: GFR calc Af Amer: >60 mL/min (ref >60)

## 2010-11-15 LAB — CBC
HCT: 40.3 % (ref 39.0–52.0)
MCHC: 34.8 g/dL (ref 30.0–36.0)
MCV: 96.7 fL (ref 78.0–100.0)
Platelets: 184 10*3/uL (ref 150–400)
RDW: 13 % (ref 11.5–15.5)

## 2010-11-17 ENCOUNTER — Ambulatory Visit (HOSPITAL_COMMUNITY)
Admission: RE | Admit: 2010-11-17 | Discharge: 2010-11-17 | Disposition: A | Discharge status: Home / Self Care | Payer: Medicare Other | Source: Ambulatory Visit | Attending: Orthopedic Surgery | Admitting: Orthopedic Surgery

## 2010-11-17 ENCOUNTER — Other Ambulatory Visit (HOSPITAL_COMMUNITY): Payer: Self-pay | Admitting: Orthopedic Surgery

## 2010-11-17 DIAGNOSIS — IMO0002 Reserved for concepts with insufficient information to code with codable children: Secondary | ICD-10-CM | POA: Insufficient documentation

## 2010-11-17 DIAGNOSIS — M25569 Pain in unspecified knee: Secondary | ICD-10-CM | POA: Insufficient documentation

## 2010-11-17 DIAGNOSIS — M1710 Unilateral primary osteoarthritis, unspecified knee: Secondary | ICD-10-CM | POA: Insufficient documentation

## 2010-11-17 DIAGNOSIS — M1990 Unspecified osteoarthritis, unspecified site: Secondary | ICD-10-CM

## 2010-12-01 ENCOUNTER — Other Ambulatory Visit (HOSPITAL_COMMUNITY): Payer: Self-pay | Admitting: Orthopedic Surgery

## 2010-12-01 ENCOUNTER — Encounter (HOSPITAL_COMMUNITY)
Admission: RE | Admit: 2010-12-01 | Discharge: 2010-12-01 | Disposition: A | Discharge status: Home / Self Care | Payer: Medicare Other | Source: Ambulatory Visit | Attending: Orthopedic Surgery | Admitting: Orthopedic Surgery

## 2010-12-01 DIAGNOSIS — Z01818 Encounter for other preprocedural examination: Secondary | ICD-10-CM | POA: Insufficient documentation

## 2010-12-01 DIAGNOSIS — M2390 Unspecified internal derangement of unspecified knee: Secondary | ICD-10-CM

## 2010-12-01 DIAGNOSIS — F17200 Nicotine dependence, unspecified, uncomplicated: Secondary | ICD-10-CM | POA: Insufficient documentation

## 2010-12-01 LAB — URINALYSIS, ROUTINE W REFLEX MICROSCOPIC
Hgb urine dipstick: NEGATIVE
Nitrite: NEGATIVE
Protein, ur: NEGATIVE mg/dL
Urobilinogen, UA: 0.2 mg/dL (ref 0.0–1.0)

## 2010-12-01 LAB — CBC
MCHC: 35.5 g/dL (ref 30.0–36.0)
Platelets: 218 10*3/uL (ref 150–400)
RDW: 13.8 % (ref 11.5–15.5)
WBC: 10.8 10*3/uL — ABNORMAL HIGH (ref 4.0–10.5)

## 2010-12-01 LAB — COMPREHENSIVE METABOLIC PANEL
AST: 23 U/L (ref 0–37)
CO2: 25 mEq/L (ref 19–32)
Calcium: 9 mg/dL (ref 8.4–10.5)
Creatinine, Ser: 0.94 mg/dL (ref 0.4–1.5)
GFR calc Af Amer: >60 mL/min (ref >60)
GFR calc non Af Amer: >60 mL/min (ref >60)
Total Protein: 7.3 g/dL (ref 6.0–8.3)

## 2010-12-02 ENCOUNTER — Other Ambulatory Visit (HOSPITAL_COMMUNITY): Payer: Medicare Other

## 2010-12-12 ENCOUNTER — Emergency Department (HOSPITAL_COMMUNITY)
Admission: EM | Admit: 2010-12-12 | Discharge: 2010-12-12 | Disposition: A | Discharge status: Home / Self Care | Payer: Medicare Other | Attending: Emergency Medicine | Admitting: Emergency Medicine

## 2010-12-12 ENCOUNTER — Emergency Department (HOSPITAL_COMMUNITY): Payer: Medicare Other

## 2010-12-12 ENCOUNTER — Encounter (HOSPITAL_COMMUNITY): Payer: Self-pay | Admitting: Radiology

## 2010-12-12 DIAGNOSIS — K625 Hemorrhage of anus and rectum: Secondary | ICD-10-CM | POA: Insufficient documentation

## 2010-12-12 DIAGNOSIS — K5289 Other specified noninfective gastroenteritis and colitis: Secondary | ICD-10-CM | POA: Insufficient documentation

## 2010-12-12 DIAGNOSIS — G8929 Other chronic pain: Secondary | ICD-10-CM | POA: Insufficient documentation

## 2010-12-12 DIAGNOSIS — Z79899 Other long term (current) drug therapy: Secondary | ICD-10-CM | POA: Insufficient documentation

## 2010-12-12 DIAGNOSIS — K921 Melena: Secondary | ICD-10-CM | POA: Insufficient documentation

## 2010-12-12 DIAGNOSIS — R1032 Left lower quadrant pain: Secondary | ICD-10-CM | POA: Insufficient documentation

## 2010-12-12 DIAGNOSIS — M549 Dorsalgia, unspecified: Secondary | ICD-10-CM | POA: Insufficient documentation

## 2010-12-12 HISTORY — DX: Essential (primary) hypertension: I10

## 2010-12-12 LAB — DIFFERENTIAL
Basophils Absolute: 0.1 10*3/uL (ref 0.0–0.1)
Eosinophils Absolute: 0.2 10*3/uL (ref 0.0–0.7)
Eosinophils Relative: 1 % (ref 0–5)
Lymphocytes Relative: 18 % (ref 12–46)

## 2010-12-12 LAB — CBC
HCT: 46.6 % (ref 39.0–52.0)
Platelets: 236 10*3/uL (ref 150–400)
RDW: 12.9 % (ref 11.5–15.5)
WBC: 13.3 10*3/uL — ABNORMAL HIGH (ref 4.0–10.5)

## 2010-12-12 LAB — HEMOGLOBIN AND HEMATOCRIT, BLOOD
HCT: 42.5 % (ref 39.0–52.0)
Hemoglobin: 15.1 g/dL (ref 13.0–17.0)

## 2010-12-12 LAB — SAMPLE TO BLOOD BANK

## 2010-12-12 LAB — PROTIME-INR: INR: 0.93 (ref 0.00–1.49)

## 2010-12-12 LAB — COMPREHENSIVE METABOLIC PANEL
Albumin: 3.8 g/dL (ref 3.5–5.2)
BUN: 9 mg/dL (ref 6–23)
Creatinine, Ser: 0.74 mg/dL (ref 0.4–1.5)
Total Bilirubin: 0.3 mg/dL (ref 0.3–1.2)
Total Protein: 8 g/dL (ref 6.0–8.3)

## 2010-12-12 LAB — URINALYSIS, ROUTINE W REFLEX MICROSCOPIC
Ketones, ur: NEGATIVE mg/dL
Nitrite: NEGATIVE
Protein, ur: NEGATIVE mg/dL

## 2010-12-12 MED ORDER — IOHEXOL 300 MG/ML  SOLN
100.0000 mL | Freq: Once | INTRAMUSCULAR | Status: AC | PRN
  Administered 2010-12-12: 100 mL via INTRAVENOUS

## 2010-12-14 ENCOUNTER — Telehealth: Payer: Self-pay | Admitting: Internal Medicine

## 2010-12-14 NOTE — Telephone Encounter (Signed)
Patient was seen in the ER for colitis he is still having blood in the stool and abdominal pain.  He will come in tomorrow and see Dr Jarold Motto at 2:00

## 2010-12-15 ENCOUNTER — Ambulatory Visit: Payer: Medicare Other | Admitting: Gastroenterology

## 2010-12-22 NOTE — Discharge Summary (Signed)
NAME:  Samuel Massey, Samuel Massey NO.:  [id number]   MEDICAL RECORD NO.:  [id number]          PATIENT TYPE:  INP   LOCATION:  5024                         FACILITY:  [facility]   PHYSICIAN:  Vanita Panda. Magnus Ivan, M.D.DATE OF BIRTH:  1956-02-25   DATE OF ADMISSION:  02/09/2009  DATE OF DISCHARGE:  02/17/2009                               DISCHARGE SUMMARY   ADMITTING DIAGNOSIS:  Left knee bicondylar tibial plateau fracture.   DISCHARGE DIAGNOSIS:  Left knee bicondylar tibial plateau fracture.   PROCEDURES:  Open reduction and internal fixation of left knee  bicondylar tibial plateau fracture on February 11, 2009.   HOSPITAL COURSE:  Samuel Massey is a 55 year old gentleman who on the day  of admission sustained a mechanical fall down the stairs at home.  He is  someone who was on disability secondary to chronic back pain.  He says  that sometimes he gets them while trying a back brace, but he just  stumbled down the stairs.  He was seen at the [facility] Emergency Room  and found to have bicondylar tibial plateau fracture.  He was admitted  for elevation and ice with definitively needing to have surgery when the  swelling had subsided.  By hospital day #2, it was felt that he would  benefit from surgery.  A CT scan confirmed the bicondylar nature of the  fracture.  He was taken to the operating room on February 11, 2009, where he  underwent open reduction and fixation of his left knee bicondylar tibial  plateau fracture.  Postoperatively, he was admitted to back to an  orthopedic floor bed and had an uneventful hospital convalescence.  We  had him on Lovenox for DVT prophylaxis and he had a considerable amount  of pain.  Given his chronic pain in the past, he was on PCA and  eventually got weaned.  He began working with physical therapy and  occupational therapy intermittently.  They at one time had recommended  skilled nursing placement, but the patient said he did not want this.  He  said, he had friend and brother at home, they could assist with him.  By the day of discharge, he was tolerating oral pain medicines as well  as oral diet and it was felt that he could be discharged safely to home  with close home health therapy follow up.   DISPOSITION:  To home.   DISCHARGE MEDICATIONS:  Percocet and Robaxin, both as needed.   DISCHARGE INSTRUCTIONS:  While he is at home, he can get his knee  incisions wet in the shower.  He will otherwise keep them clean and dry.  A follow up appointment will be established at [facility] in 1  week after discharge.      Vanita Panda. Magnus Ivan, M.D.  Electronically Signed     CYB/MEDQ  D:  02/17/2009  T:  02/17/2009  Job:  [id number]

## 2010-12-22 NOTE — Procedures (Signed)
DUPLEX DEEP VENOUS EXAM - LOWER EXTREMITY   INDICATION:  Chronic left lower extremity edema.   HISTORY:  Edema:  Chronic left lower extremity edema.  Trauma/Surgery:  History of back and knee surgeries.  Pain:  Chronic back and left knee pain.  PE:  No.  Previous DVT:  No.  Anticoagulants:  Yes.  Other:   DUPLEX EXAM:                CFV   SFV   PopV  PTV    GSV                R  L  R  L  R  L  R   L  R  L  Thrombosis    +  +     +     +      +     +  Spontaneous   0  0     0     0      0     0  Phasic        0  0     0     0      0     0  Augmentation  0  0     0     0      0     0  Compressible  0  0     0     0      0     0  Competent        0     0     0      0     0   Legend:  + - yes  o - no  p - partial  D - decreased   IMPRESSION:  1. No evidence of deep venous thrombosis noted in the left lower      extremity.  2. Reflux is noted throughout the left deep venous system and the      greater and proximal lesser saphenous veins.    _____________________________  Janetta Hora Fields, MD   CH/MEDQ  D:  07/16/2009  T:  07/16/2009  Job:  [id number]

## 2010-12-22 NOTE — Op Note (Signed)
NAME:  Samuel Massey, Samuel Massey NO.:  [id number]   MEDICAL RECORD NO.:  [id number]          PATIENT TYPE:  INP   LOCATION:  3036                         FACILITY:  [facility]   PHYSICIAN:  Kathaleen Maser. Pool, M.D.    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   PROCEDURE:  L4-5 bilateral redo laminotomies/laminectomies with  bilateral L4-L5 decompressive foraminotomies, more than would be  required for simple interbody fusion alone.  L4-5 posterior lumbar  interbody fusion utilizing tangent interbody allograft wedge, Telamon  interbody PEEK cage and local autografting.  L4-5 posterolateral  arthrodesis utilizing nonsegmental pedicle screw fixation and local  autograft.   SURGEON:  Kathaleen Maser. Pool, MD   ASSISTANT:  Reinaldo Meeker, MD   ANESTHESIA:  General endotracheal.   INDICATION:  Samuel Massey is a 55 year old male status post bilateral L4-  5 laminotomies and diskectomies.  The patient presents with intractable  back and lower extremity pain.  Workup demonstrates evidence of  instability and disk space collapse at L4-5 with some degree of  recurrent disk herniation and facet arthropathy and stenosis.  The  patient has been counseled as to the options.  He decided to proceed  with an L4-5 decompression and fusion with instrumentation.   OPERATIVE NOTE:  The patient was brought to the operating room and  placed on the operating table in supine position..  After adequate level  of anesthesia was achieved, the patient was placed prone onto Wilson  frame and appropriately padded.  The patient's lumbar region was prepped  and draped sterilely.  A 10  blade was used to make a curvilinear skin  incision overlying the L3, L4 and L5 levels.  This was carried down  sharply in the midline.  A subperiosteal dissection was then performed  exposing the lamina facet joints of L4-L5 as well as the transverse  processes of L4 and L5.  Deep self-retaining retractor was placed.  Intraoperative fluoroscopy was used and  the levels were confirmed.  Previous laminotomies at L4-5 was dissected free bilaterally.  Epidural  scar was resected.  Complete laminectomy of L4 was performed  bilaterally.  Inferior facetectomies of L4 was performed bilaterally.  Superior facetectomies at L5 and partial superior laminectomy of L5 was  performed bilaterally.  All bone was cleaned and using later  autografting.  Ligamentum flavum and residual epidural scar were then  elevated and resected in a piecemeal fashion.  Underlying thecal sac and  exiting L4-L5 nerve roots were identified.  Wide decompressive  foraminotomies were then performed along the course of the exiting nerve  roots bilaterally.  Epidural venous plexus was coagulated and cut.  Bilateral diskectomies were then performed.  Interbody fusion was then  performed using tangent instruments.  The disk space was then distracted  10 mm.  Contralaterally, the  disk space was then reamed and then cut  with 10 mm tangent chisel and the soft tissue was removed from the  interspace.  A 10 x 22 mm Telamon cage was packed with morselized  autograft and placed in the right side.  Distractor was removed from the  patient's left side.  Thecal sac and nerve root were retracted from the  left side.  Disk space was then once again reamed and then cut with 10  mm tangent instrument.  The soft tissue was removed from the interspace.  Morselized autograft was packed in interspace.  A 10 x 26 mm tangent  wedge was then packed into place and recessed approximately 2 mm from  posterior cortical margin of L4.  Pedicles of L4 and L5  were then  identified using surface landmarks and intraoperative fluoroscopy.  Superficial bone along the pedicle was then removed using high-speed  drill.  Each pedicle was then probed using pedicle awl.  Pedicle awl  track was then tapped with 5.2 mm screw, screwed down hole was then  probed and found to be solid with bone.  A 6.75 x 45 mm radius screws  placed bilaterally at L4-L5.  The transverse processes of L4-L5 were  then decorticated with high-speed drill.  Morselized autograft was  packed posterolaterally for later fusion.  Short segment titanium rods  were placed over screw heads at L4-L5.  Locking caps were then engaged  with construct under compression.  Final images revealed good position  of bone graft and hardware at proper operative level with normal  alignment of spine.  Wound was then irrigated with antibiotic solution.  Gelfoam was placed topically for hemostasis.  A medium Hemovac drain was  left in interspace.  The wound was then closed in layers with Vicryl  sutures.  Steri-Strips and sterile dressing were applied.  There were no  complications.  The patient tolerated the procedure well and he returns  to the recovery room postoperatively.           ______________________________  Kathaleen Maser Pool, M.D.     HAP/MEDQ  D:  07/30/2008  T:  07/31/2008  Job:  [id number]

## 2010-12-22 NOTE — Op Note (Signed)
NAME:  TYSE, AURIEMMA NO.:  [id number]   MEDICAL RECORD NO.:  [id number]          PATIENT TYPE:  INP   LOCATION:  5024                         FACILITY:  [facility]   PHYSICIAN:  Vanita Panda. Magnus Ivan, M.D.DATE OF BIRTH:  [birth year]   DATE OF PROCEDURE:  02/11/2009  DATE OF DISCHARGE:                               OPERATIVE REPORT   PREOPERATIVE DIAGNOSIS:  Left knee bicondylar tibial plateau fracture.   POSTOPERATIVE DIAGNOSIS:  Left knee bicondylar tibial plateau fracture.   PROCEDURE:  Open reduction and internal fixation of left bicondylar  tibial plateau fracture.   SURGEON:  Vanita Panda. Magnus Ivan, MD   ANESTHESIA:  General.   ESTIMATED BLOOD LOSS:  Less than 100 mL.   TOURNIQUET TIME:  1 hour and 35 minutes.   COMPLICATIONS:  None.   INDICATIONS:  Briefly, Mr. Brunty is a 55 year old who is disabled  secondary to chronic low back pain.  He ambulates in his apartment but  sometimes uses a wheelchair and even a back brace.  He sustained a  mechanical fall on Sunday, February 09, 2009, down the stairs.  He was seen  at the [facility] and found to have a bicondylar tibial  plateau fracture.  He had no evidence of compartment syndrome.  I  admitted him for pain control as well as to allow soft tissues to calm  down, so surgery could be scheduled.  I did obtain a CT scan of his knee  that showed that he had a bicondylar tibial plateau fracture and a  depression of the posterolateral aspect of the plateau.  I recommended  that he undergo open reduction and internal fixation of this fracture.  The risks and benefits of this were explained to him in length, he  understood the need and agreed to proceed with surgery.   PROCEDURE DESCRIPTION:  After informed consent was obtained, appropriate  left knee was marked.  He was brought to the operating room, placed  supine on the operating table.  General anesthesia was then obtained.  A  nonsterile  tourniquet was placed around his upper left thigh.  His leg  was prepped and draped from the thigh down to the ankle with DuraPrep  and sterile drapes were applied including sterile stockinette.  A time-  out was called to identify the correct patient, correct left knee.  I  then used an Esmarch to wrap out the leg and the tourniquet was inflated  to 300 milliliters of pressure.  I first made an incision on the medial  side of the knee several fingerbreadths medial to the tibial tubercle.  I was able to identify the split part of the medial plateau.  I then  used 2 K-wires as temporary fixation to hold this in place.  Next, I  made a lateral incision in a hockey-stick fashion on the lateral aspect  of the knee.  I was able to dissect down to the fracture site and did  not find any specific window for tamping of the bone because the lateral  side did not have any splint and was only a  depression.  Using several  Kirschner wires, I was able to put K-wires in the depressed piece and  filled this back up.  I then placed a 4.5-mm plate on the lateral aspect  of the tibial plateau and secured this with the 3 screws under the  tibial plateau and then what called a kickstand screw and then 2  bicortical screws distally.  I then went back to the medial side and  placed a small buttress type of plate with 3 screws proximally and 2  distally to further buttress of the medial side.  This was all performed  under direct fluoroscopic guidance.  I then removed all K-wires and was  able to irrigate both wounds thoroughly.  I closed the deep tissue with  0 Vicryl suture, followed by 2-0 Vicryl in subcutaneous tissue, and  interrupted staples on the skin through the 2 separate incisions.  The  skin incision was infiltrated with 0.25% plain Marcaine, Xeroform,  followed by well-padded sterile dressing was applied, and the tourniquet  was let down.  The patient was awakened, extubated, and taken to  recovery  room in stable condition.  His foot had a nice pulse and pink  in nicely as well.      Vanita Panda. Magnus Ivan, M.D.  Electronically Signed     CYB/MEDQ  D:  02/11/2009  T:  02/12/2009  Job:  [id number]

## 2010-12-22 NOTE — Assessment & Plan Note (Signed)
OFFICE VISIT   General, Samuel Massey  DOB:  [birth year]                                       07/16/2009  ZOXWR#:[id number]   CHIEF COMPLAINT:  Left leg swelling.   HISTORY OF PRESENT ILLNESS:  The patient is a 55 year old male who  suffered trauma to his left knee requiring orthopedic operative repair  by Dr. Magnus Ivan.  This was done in July of 2010.  Since that time he has  had difficulty bearing weight on his leg.  He also a history of chronic  back pain and has had difficulty walking secondary to this as well.  He  states that he swells from his calf down into his foot.  He states that  he has had thrombosis of what sounds like superficial varicosities on  two previous occasions in the remote past.  He denies any ulcerations on  the leg.  He states that sometimes his right leg cramps up but he has no  problems with swelling in this.  He states the swelling in his left leg  is exacerbated by standing.  Leg elevation slightly improves it.  He has  not previously worn compression stockings.   Chronic problems include chronic post traumatic pain from his tibial  plateau fracture as well as lumbar post laminectomy syndrome, both of  which are followed by Dr. Wynn Banker and are stable in character.   He denies history of diabetes, hypertension, elevated cholesterol or  coronary artery disease.   PAST SURGICAL HISTORY:  1996 lumbar disk surgery, 1997 left shoulder  surgery, 2006 lumbar disk surgery, 2009 back fusion, 2010 left knee  fracture.   FAMILY HISTORY:  Unremarkable.   SOCIAL HISTORY:  He is single.  He is on disability.  He smokes 1 pack  of cigarettes per day.  He does not consume alcohol regularly.   REVIEW OF SYSTEMS:  Full 12 point review of systems was performed with  the patient.  Please see intake referral form for details regarding  this.   He had a venous duplex exam today which showed no evidence of DVT but  did show incompetency of his  deep and superficial venous system in the  left leg.   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  This probably contributes to  some of his symptoms of swelling in the left leg.  However, his recent  trauma probably contributes to this as well as well as his prior back  problems.  I believe the best option for him at this point would be  bilateral compression stockings for symptomatic relief.  If he continues  to have problems long-term we could consider laser ablation of his  superficial venous system.  However, with all of his chronic pain issues  I believe the best management for now would be conservative in nature  and avoiding any other interventional procedures until he has had time  to completely heal up from his [name].     Janetta Hora. Fields, MD  Electronically Signed   CEF/MEDQ  D:  07/17/2009  T:  07/17/2009  Job:  [id number]   cc:   Erick Colace, M.D.

## 2010-12-25 NOTE — Op Note (Signed)
NAME:  Samuel Massey, Samuel Massey NO.:  [id number]   MEDICAL RECORD NO.:  [id number]          PATIENT TYPE:  OIB   LOCATION:  2899                         FACILITY:  [facility]   PHYSICIAN:  Kathaleen Maser. Pool, M.D.    DATE OF BIRTH:  25-Sep-1955   DATE OF PROCEDURE:  10/05/2004  DATE OF DISCHARGE:                                 OPERATIVE REPORT   PREOPERATIVE DIAGNOSIS:  Right L4-5 herniated nucleus pulposus with  radiculopathy.   POSTOPERATIVE DIAGNOSIS:  Right L4-5 herniated nucleus pulposus with  radiculopathy.   PROCEDURE NAME:  Right L4-5 laminotomy with microdiskectomy.   SURGEON:  Kathaleen Maser. Pool, M.D.   ASSISTANT:  Reinaldo Meeker, M.D.   ANESTHESIA:  General oral endotracheal.   INDICATIONS:  Mr. Kulinski is a 55 year old male who is status post a  previous left-sided L4-5 laminotomy and microdiskectomy remotely in the  past, who presents now with back and right lower extremity pain consistent  with a right-sided L5 radiculopathy.  Workup demonstrates evidence of a  reasonably large right-sided L4-5 disk herniation with progression to the  right-sided L5 nerve root and thecal sac.  The patient has been counseled as  to his options.  He has decided to proceed with a right-sided L4-5  laminotomy and microdiskectomy in hopes of improving his symptoms.   OPERATIVE NOTE:  The patient was placed on the operating table in the supine  position.  After an adequate level of anesthesia was achieved, the patient  was positioned prone onto a Wilson frame with appropriate padding.  The  patient's lumbar region was prepped and draped sterilely.  A 10 blade was  used to make a linear skin incision overlying the L4-5 interspace.  This was  carried down sharply in the midline.  Subperiosteal dissection performed on  the right side exposing the lamina facet joints of L4 and L5.  A deep self-  retaining retractor was placed.  Intraoperative x-rays were taken and the  level was confirmed.   A laminotomy was then performed using the high-speed  drill and Kerrison rongeurs to remove the inferior aspect of the lamina of  L4, the medial aspect of the L4-5 facet joint and the superior rim of the L5  lamina.  The ligamentum flavum was then elevated and resected in a piecemeal  fashion using Kerrison rongeurs.  The underlying thecal sac and exiting L5  nerve root were identified.  The microscope was brought in for  microdissection of the right-sided L5 nerve root and underlying disk  herniation.  Epidural venous plexus were coagulated and cut.  The thecal sac  and L5 nerve root were mobilized and tracked towards the midline.  Disk  herniation was readily apparent.  The disk was then incised with a 15 blade  in a rectangular fashion.  A wide disk space was then achieved using  pituitary rongeurs, up-biting rongeurs and Epstein curets.  All elements of  the disk herniation were completely resected.  All loose degenerative disk  material was removed from the interspace.  After a very thorough diskectomy  had been performed, the spinal canal  was inspected.  There was no evidence  of any residual compression nor was there any injury to the thecal sac or  nerve roots.  The wound was then irrigated with antibiotic solution.  Gelfoam was placed topically for hemostasis.  The  wound was then closed in layers with Vicryl sutures.  Steri-Strips and a  sterile dressing were applied.  There were no apparent complications.  The  patient tolerated the procedure well and he returned to the recovery room  postoperatively.      HAP/MEDQ  D:  10/05/2004  T:  10/05/2004  Job:  [id number]

## 2011-01-06 ENCOUNTER — Ambulatory Visit (HOSPITAL_COMMUNITY)
Admission: RE | Admit: 2011-01-06 | Discharge: 2011-01-06 | Disposition: A | Discharge status: Home / Self Care | Payer: Medicare Other | Source: Ambulatory Visit | Attending: Orthopedic Surgery | Admitting: Orthopedic Surgery

## 2011-01-06 DIAGNOSIS — M2340 Loose body in knee, unspecified knee: Secondary | ICD-10-CM | POA: Insufficient documentation

## 2011-01-06 DIAGNOSIS — Z01812 Encounter for preprocedural laboratory examination: Secondary | ICD-10-CM | POA: Insufficient documentation

## 2011-01-06 DIAGNOSIS — M659 Unspecified synovitis and tenosynovitis, unspecified site: Secondary | ICD-10-CM | POA: Insufficient documentation

## 2011-01-06 DIAGNOSIS — M2390 Unspecified internal derangement of unspecified knee: Secondary | ICD-10-CM | POA: Insufficient documentation

## 2011-01-06 DIAGNOSIS — Z01818 Encounter for other preprocedural examination: Secondary | ICD-10-CM | POA: Insufficient documentation

## 2011-01-06 LAB — CBC
Hemoglobin: 16.2 g/dL (ref 13.0–17.0)
MCH: 32.4 pg (ref 26.0–34.0)
RBC: 5 MIL/uL (ref 4.22–5.81)
WBC: 9.6 10*3/uL (ref 4.0–10.5)

## 2011-01-06 LAB — COMPREHENSIVE METABOLIC PANEL
ALT: 18 U/L (ref 0–53)
AST: 23 U/L (ref 0–37)
Alkaline Phosphatase: 98 U/L (ref 39–117)
CO2: 27 mEq/L (ref 19–32)
Chloride: 102 mEq/L (ref 96–112)
GFR calc Af Amer: >60 mL/min (ref >60)
GFR calc non Af Amer: >60 mL/min (ref >60)
Potassium: 4.8 mEq/L (ref 3.5–5.1)
Sodium: 140 mEq/L (ref 135–145)
Total Bilirubin: 0.2 mg/dL — ABNORMAL LOW (ref 0.3–1.2)

## 2011-01-06 LAB — URINALYSIS, ROUTINE W REFLEX MICROSCOPIC
Bilirubin Urine: NEGATIVE
Nitrite: NEGATIVE
Protein, ur: NEGATIVE mg/dL
Specific Gravity, Urine: 1.018 (ref 1.005–1.030)
Urobilinogen, UA: 0.2 mg/dL (ref 0.0–1.0)

## 2011-01-08 NOTE — Op Note (Signed)
  NAME:  Samuel Massey, PUNCH NO.:  [id number]  MEDICAL RECORD NO.:  [id number]           PATIENT TYPE:  O  LOCATION:  SDSC                         FACILITY:  [facility]  PHYSICIAN:  Myrtie Neither, MD      DATE OF BIRTH:  1956-03-20  DATE OF PROCEDURE:  01/06/2011 DATE OF DISCHARGE:  01/06/2011                              OPERATIVE REPORT   PREOPERATIVE DIAGNOSIS:  Internal derangement, left knee.  POSTOPERATIVE DIAGNOSES:  Synovitis, chondral defect with loose bodies, lateral tibial plateau surface.  ANESTHESIA:  General.  PROCEDURES: 1. Arthroscopic removal of loose bodies. 2. Synovectomy, left knee.  DESCRIPTION OF PROCEDURE:  The patient was taken to the operating room. After given adequate preop medications, given general anesthesia and was intubated, left knee was prepped with DuraPrep and draped in a sterile manner.  Tourniquet used for hemostasis.  A one-half-inch puncture wound made along the anterior, medial, and lateral joint line.  Inflow was through the medial suprapatellar pouch area.  Inspection of the joint revealed thickened plica and anterior synovium with chondral defect along the lateral tibial plateau surface with fragmentation of the tibial plateau surface laterally.  It was pretty obvious step-off between the more lateral aspect of the tibial plateau laterally and the more medial section.  With synovial shaver, synovectomy was done. Removal of loose bodies was done.  Copious irrigation was done.  Wound closure was then done with 0 nylon.  A 14 mL of 0.25% Marcaine with epinephrine was injected to the knee.  Compressive dressing was applied. The patient tolerated the procedure quite well and went to the recovery room in stable and satisfactory condition.  The patient is being kept 23- hour observation for pain control, partial weightbearing on the left side, and will be discharged on Percocet 10/650 one q.6 h. p.r.n. for pain, continue ice  packs, and return to the office in 1 week.  The patient will be discharged in stable and satisfactory condition.     Myrtie Neither, MD     AC/MEDQ  D:  01/06/2011  T:  01/07/2011  Job:  [id number]  Electronically Signed by Myrtie Neither MD on 01/08/2011 02:30:22 PM

## 2011-01-08 NOTE — H&P (Signed)
  NAME:  Samuel Massey, Samuel Massey NO.:  [id number]  MEDICAL RECORD NO.:  [id number]           PATIENT TYPE:  O  LOCATION:  SDSC                         FACILITY:  [facility]  PHYSICIAN:  Myrtie Neither, MD      DATE OF BIRTH:  05/11/1956  DATE OF ADMISSION:  01/06/2011 DATE OF DISCHARGE:  01/06/2011                             HISTORY & PHYSICAL   CHIEF COMPLAINT:  Painful left knee.  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  The patient has been treated with anti-inflammatories, pain medication, and Synvisc injection.  The patient has done fairly well comply within the last 2 months progressively worsened.  With pain, swelling, stiffness, loss of function, and difficulty weightbearing on the left side.  PAST MEDICAL HISTORY: 1. Left shoulder rotator cuff repair. 2. Degenerative disk disease. 3. Tibial plateau fracture with ORIF of left knee. 4. Removal of hardware, left knee. 5. Previous arthroscopy of the left knee. 6. History of hypertension. 7. History of deep vein thrombosis. 8. Degenerative joint disease. 9. Hiatal hernia reflux. 10.History of migraine headaches.  ALLERGIES:  CODEINE, FLEXERIL, and NSAIDs.  SOCIAL HISTORY:  Use of tobacco 1 pack per day.  Denies use of illegal drugs or alcohol.  REVIEW OF SYSTEMS:  Sinus cough, low back pain due to disk disease, otherwise history of present illness.  FAMILY HISTORY:  Noncontributory.  MEDICATIONS: 1. Tribenzor 20/5/12.5 daily. 2. Famotidine over the counter twice daily. 3. Bronkaid over the counter twice daily. 4. Tylenol Sinus twice daily p.r.n. 5. Hydrocodone 10/325 one daily p.r.n. 6. Multivitamins. 7. Aspirin 81 daily. 8. Lyrica 150 mg daily.  PHYSICAL EXAMINATION:  GENERAL:  Alert and oriented.  In no acute distress. VITAL SIGNS:  Height 182.88, weight  101, temperature 97.1, pulse 81, respirations 20, O2 saturation 94%, blood pressure 166/127. HEAD:  Normocephalic. EYES:  Conjunctivae and sclerae clear. NECK:  Supple. CHEST:  Clear. CARDIAC:  S1 and S2 regular. EXTREMITIES:  Left knee quad weakness, tender medial and lateral compartments, palpable or audible click in lateral compartment.  Range of motion good but limited.  Negative drawer's.  Negative Lachman test.  IMPRESSION:  Internal derangement, left knee; reflexes sympathetic dystrophy; history of hypertension.  PLAN:  Arthroscopy, left knee.     Myrtie Neither, MD     AC/MEDQ  D:  01/06/2011  T:  01/07/2011  Job:  [id number]  Electronically Signed by Myrtie Neither MD on 01/08/2011 02:30:19 PM

## 2011-02-03 ENCOUNTER — Ambulatory Visit: Payer: Medicare Other | Attending: Orthopedic Surgery | Admitting: Physical Therapy

## 2011-02-03 ENCOUNTER — Ambulatory Visit: Payer: Medicare Other | Admitting: Physical Therapy

## 2011-02-03 DIAGNOSIS — M25569 Pain in unspecified knee: Secondary | ICD-10-CM | POA: Insufficient documentation

## 2011-02-03 DIAGNOSIS — M25669 Stiffness of unspecified knee, not elsewhere classified: Secondary | ICD-10-CM | POA: Insufficient documentation

## 2011-02-03 DIAGNOSIS — IMO0001 Reserved for inherently not codable concepts without codable children: Secondary | ICD-10-CM | POA: Insufficient documentation

## 2011-02-03 DIAGNOSIS — M6281 Muscle weakness (generalized): Secondary | ICD-10-CM | POA: Insufficient documentation

## 2011-02-03 DIAGNOSIS — R262 Difficulty in walking, not elsewhere classified: Secondary | ICD-10-CM | POA: Insufficient documentation

## 2011-02-08 ENCOUNTER — Ambulatory Visit: Payer: Medicare Other | Attending: Orthopedic Surgery | Admitting: Physical Therapy

## 2011-02-08 DIAGNOSIS — M25569 Pain in unspecified knee: Secondary | ICD-10-CM | POA: Insufficient documentation

## 2011-02-08 DIAGNOSIS — R262 Difficulty in walking, not elsewhere classified: Secondary | ICD-10-CM | POA: Insufficient documentation

## 2011-02-08 DIAGNOSIS — M6281 Muscle weakness (generalized): Secondary | ICD-10-CM | POA: Insufficient documentation

## 2011-02-08 DIAGNOSIS — M25669 Stiffness of unspecified knee, not elsewhere classified: Secondary | ICD-10-CM | POA: Insufficient documentation

## 2011-02-08 DIAGNOSIS — IMO0001 Reserved for inherently not codable concepts without codable children: Secondary | ICD-10-CM | POA: Insufficient documentation

## 2011-02-16 ENCOUNTER — Encounter: Payer: Medicare Other | Admitting: Physical Therapy

## 2011-02-22 ENCOUNTER — Ambulatory Visit: Payer: Medicare Other | Admitting: Physical Therapy

## 2011-02-25 ENCOUNTER — Encounter: Payer: Medicare Other | Admitting: Physical Therapy

## 2011-03-01 ENCOUNTER — Encounter: Payer: Medicare Other | Admitting: Physical Therapy

## 2011-03-10 ENCOUNTER — Encounter: Payer: Medicare Other | Admitting: Physical Therapy

## 2011-03-11 ENCOUNTER — Ambulatory Visit: Payer: Medicare Other | Attending: Orthopedic Surgery | Admitting: Physical Therapy

## 2011-03-11 DIAGNOSIS — IMO0001 Reserved for inherently not codable concepts without codable children: Secondary | ICD-10-CM | POA: Insufficient documentation

## 2011-03-11 DIAGNOSIS — M6281 Muscle weakness (generalized): Secondary | ICD-10-CM | POA: Insufficient documentation

## 2011-03-11 DIAGNOSIS — M25569 Pain in unspecified knee: Secondary | ICD-10-CM | POA: Insufficient documentation

## 2011-03-11 DIAGNOSIS — R262 Difficulty in walking, not elsewhere classified: Secondary | ICD-10-CM | POA: Insufficient documentation

## 2011-03-11 DIAGNOSIS — M25669 Stiffness of unspecified knee, not elsewhere classified: Secondary | ICD-10-CM | POA: Insufficient documentation

## 2011-03-16 ENCOUNTER — Ambulatory Visit: Payer: Medicare Other | Admitting: Physical Therapy

## 2011-05-04 ENCOUNTER — Emergency Department (HOSPITAL_COMMUNITY)
Admission: EM | Admit: 2011-05-04 | Discharge: 2011-05-04 | Discharge status: Against Medical Advice | Payer: Medicare Other | Attending: Emergency Medicine | Admitting: Emergency Medicine

## 2011-05-04 ENCOUNTER — Emergency Department (HOSPITAL_COMMUNITY): Payer: Medicare Other

## 2011-05-04 DIAGNOSIS — G2581 Restless legs syndrome: Secondary | ICD-10-CM | POA: Insufficient documentation

## 2011-05-04 DIAGNOSIS — F17200 Nicotine dependence, unspecified, uncomplicated: Secondary | ICD-10-CM | POA: Insufficient documentation

## 2011-05-04 DIAGNOSIS — Z79899 Other long term (current) drug therapy: Secondary | ICD-10-CM | POA: Insufficient documentation

## 2011-05-04 DIAGNOSIS — R112 Nausea with vomiting, unspecified: Secondary | ICD-10-CM | POA: Insufficient documentation

## 2011-05-04 DIAGNOSIS — R109 Unspecified abdominal pain: Secondary | ICD-10-CM | POA: Insufficient documentation

## 2011-05-04 DIAGNOSIS — K811 Chronic cholecystitis: Secondary | ICD-10-CM | POA: Insufficient documentation

## 2011-05-04 LAB — CBC
HCT: 45.6 % (ref 39.0–52.0)
MCH: 32.2 pg (ref 26.0–34.0)
MCHC: 36 g/dL (ref 30.0–36.0)
RDW: 13.2 % (ref 11.5–15.5)

## 2011-05-04 LAB — DIFFERENTIAL
Basophils Absolute: 0 10*3/uL (ref 0.0–0.1)
Basophils Relative: 0 % (ref 0–1)
Eosinophils Relative: 0 % (ref 0–5)
Monocytes Absolute: 0.9 10*3/uL (ref 0.1–1.0)
Monocytes Relative: 8 % (ref 3–12)

## 2011-05-04 LAB — COMPREHENSIVE METABOLIC PANEL
Albumin: 4.6 g/dL (ref 3.5–5.2)
Alkaline Phosphatase: 72 U/L (ref 39–117)
BUN: 14 mg/dL (ref 6–23)
Calcium: 10.4 mg/dL (ref 8.4–10.5)
GFR calc Af Amer: >60 mL/min (ref >60)
Glucose, Bld: 120 mg/dL — ABNORMAL HIGH (ref 70–99)
Potassium: 4.3 mEq/L (ref 3.5–5.1)
Sodium: 139 mEq/L (ref 135–145)
Total Protein: 8.7 g/dL — ABNORMAL HIGH (ref 6.0–8.3)

## 2011-05-04 LAB — URINALYSIS, ROUTINE W REFLEX MICROSCOPIC
Bilirubin Urine: NEGATIVE
Leukocytes, UA: NEGATIVE
Nitrite: NEGATIVE
Specific Gravity, Urine: 1.016 (ref 1.005–1.030)
Urobilinogen, UA: 0.2 mg/dL (ref 0.0–1.0)
pH: 5.5 (ref 5.0–8.0)

## 2011-05-04 LAB — POCT I-STAT, CHEM 8
BUN: 14 mg/dL (ref 6–23)
Creatinine, Ser: 1 mg/dL (ref 0.50–1.35)
Glucose, Bld: 126 mg/dL — ABNORMAL HIGH (ref 70–99)
Hemoglobin: 18 g/dL — ABNORMAL HIGH (ref 13.0–17.0)
TCO2: 22 mmol/L (ref 0–100)

## 2011-05-04 LAB — LIPASE, BLOOD: Lipase: 33 U/L (ref 11–59)

## 2011-05-04 MED ORDER — IOHEXOL 300 MG/ML  SOLN
100.0000 mL | Freq: Once | INTRAMUSCULAR | Status: AC | PRN
  Administered 2011-05-04: 100 mL via INTRAVENOUS

## 2011-05-05 ENCOUNTER — Emergency Department (HOSPITAL_COMMUNITY): Payer: Medicare Other

## 2011-05-14 LAB — CBC
HCT: 45.5 % (ref 39.0–52.0)
Hemoglobin: 15.4 g/dL (ref 13.0–17.0)
RBC: 4.8 MIL/uL (ref 4.22–5.81)
RDW: 13.6 % (ref 11.5–15.5)
WBC: 8.6 10*3/uL (ref 4.0–10.5)

## 2011-05-14 LAB — BASIC METABOLIC PANEL
Calcium: 9.3 mg/dL (ref 8.4–10.5)
GFR calc Af Amer: >60 mL/min (ref >60)
GFR calc non Af Amer: >60 mL/min (ref >60)
Glucose, Bld: 95 mg/dL (ref 70–99)
Potassium: 4.4 mEq/L (ref 3.5–5.1)
Sodium: 137 mEq/L (ref 135–145)

## 2011-05-14 LAB — DIFFERENTIAL
Basophils Absolute: 0.1 10*3/uL (ref 0.0–0.1)
Eosinophils Relative: 1 % (ref 0–5)
Lymphocytes Relative: 29 % (ref 12–46)
Lymphs Abs: 2.5 10*3/uL (ref 0.7–4.0)
Monocytes Absolute: 0.7 10*3/uL (ref 0.1–1.0)
Neutro Abs: 5.2 10*3/uL (ref 1.7–7.7)

## 2011-05-14 LAB — TYPE AND SCREEN

## 2011-05-14 LAB — ABO/RH: ABO/RH(D): O NEG

## 2011-06-28 ENCOUNTER — Other Ambulatory Visit (HOSPITAL_COMMUNITY): Payer: Self-pay | Admitting: Orthopedic Surgery

## 2011-06-28 ENCOUNTER — Ambulatory Visit (HOSPITAL_COMMUNITY)
Admission: RE | Admit: 2011-06-28 | Discharge: 2011-06-28 | Disposition: A | Discharge status: Home / Self Care | Payer: Medicare Other | Source: Ambulatory Visit | Attending: Orthopedic Surgery | Admitting: Orthopedic Surgery

## 2011-06-28 DIAGNOSIS — R52 Pain, unspecified: Secondary | ICD-10-CM

## 2011-06-28 DIAGNOSIS — M25569 Pain in unspecified knee: Secondary | ICD-10-CM | POA: Insufficient documentation

## 2012-01-04 ENCOUNTER — Other Ambulatory Visit (HOSPITAL_COMMUNITY): Payer: Self-pay | Admitting: Orthopedic Surgery

## 2012-01-04 ENCOUNTER — Ambulatory Visit (HOSPITAL_COMMUNITY)
Admission: RE | Admit: 2012-01-04 | Discharge: 2012-01-04 | Disposition: A | Discharge status: Home / Self Care | Payer: Medicare Other | Source: Ambulatory Visit | Attending: Orthopedic Surgery | Admitting: Orthopedic Surgery

## 2012-01-04 DIAGNOSIS — M25569 Pain in unspecified knee: Secondary | ICD-10-CM | POA: Insufficient documentation

## 2012-01-04 DIAGNOSIS — R52 Pain, unspecified: Secondary | ICD-10-CM

## 2012-01-08 IMAGING — CT CT EXTREM LOW W/O CM*L*
2 of 4 series · 4 of 14 positions shown, 5 images · non-contrast
Comparison: CT left knee 02/10/2009

CLINICAL DATA: Status post repair of a tibial plateau fracture
February 2009.  Status post fall August 2009.

CT LEFT KNEE WITHOUT CONTRAST
TECHNIQUE: Multidetector CT imaging of the left knee was performed
according to the standard protocol without intravenous contrast.
Multiplanar CT image reconstructions were also generated.

[Series 2: knee bone · axial · 0.33mm/px · z∈[-41,+46]mm · 2 of 107 slices shown, 3 images]
[im 36/107  soft-tissue]
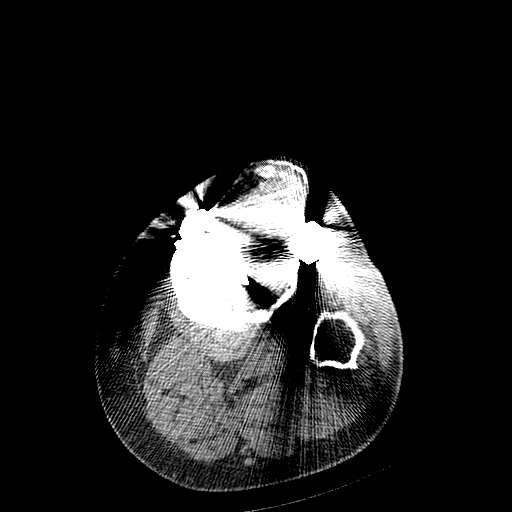
[im 36/107  bone]
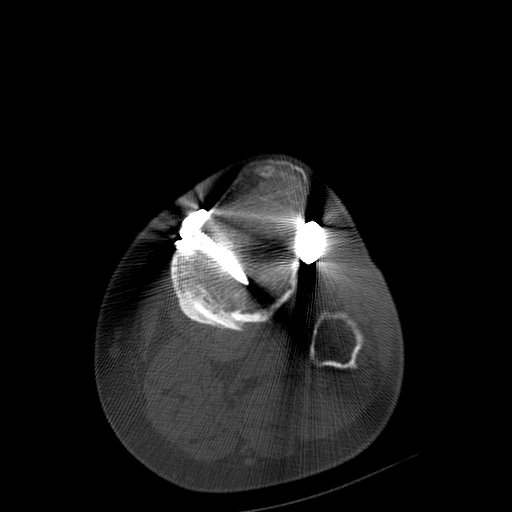
[im 71/107  bone]
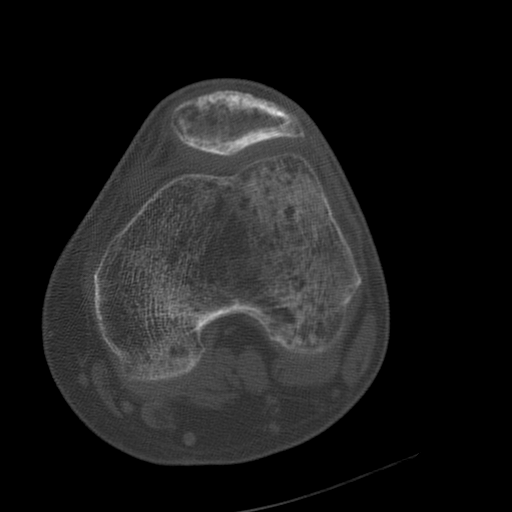

[Series 3: knee detail · axial · 0.33mm/px · z∈[-39,+49]mm · 2 of 106 slices shown]
[im 36/106  bone]
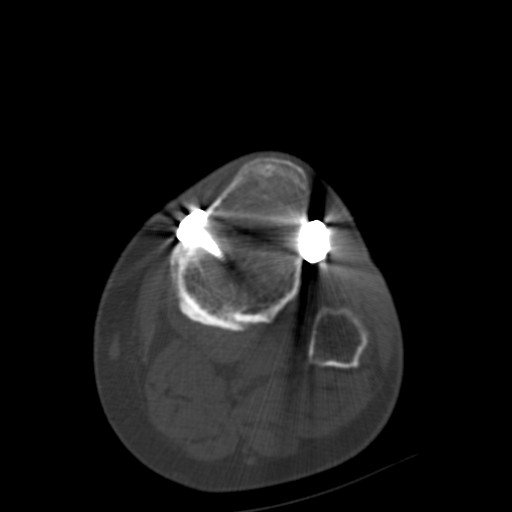
[im 71/106  bone]
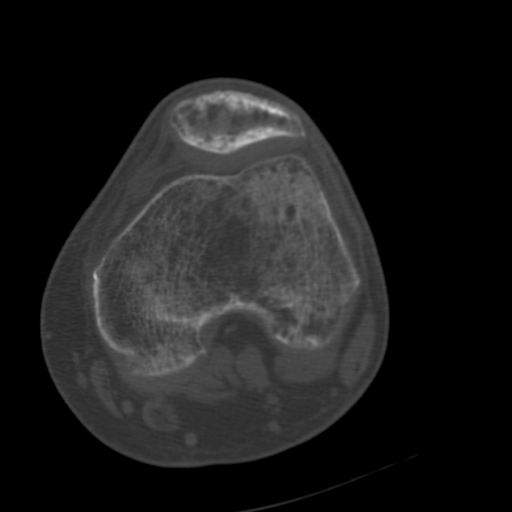

[4 of 14 positions shown; findings below may reference images not displayed]

FINDINGS: The patient has medial and lateral plate and screws
fixing a healed proximal tibial fracture which extends into the
lateral tibial plateau.  A portion of the screw from the lateral
approach is not covered by bone.  Uncovered segment screw measures
1.8 cm transverse by approximately 1 cm AP. Uncovered screw is in
the posterior margin of the tibial plateau where there is continued
impaction of 0.5 cm involving an area measuring 3.5 cm AP by up to
2 cm transverse.

All hardware appears intact and there is no lucency about the
screws or hardware to suggest loosening or infection.  Distal femur
and proximal tibia appears somewhat demineralized consistent with
disuse osteopenia.  No focal fluid collection or mass is
identified.  As visualized by CT scan, the menisci, cruciate and
collateral ligaments appear intact.
IMPRESSION: 1.  Status post ORIF of lateral tibial plateau fracture.  As
described above, there is a step off in the posterior aspect of the
lateral tibial plateau with impaction approximate 0.5 cm involving
2.0 x 3.5 cm of the articular surface.  Screw in this location
appears uncovered.
2.  Findings most compatible with disuse osteopenia or, less
likely, reflex sympathetic dystrophy.

## 2012-01-10 ENCOUNTER — Ambulatory Visit: Payer: Medicare Other | Attending: Anesthesiology | Admitting: Physical Therapy

## 2012-01-10 DIAGNOSIS — M25669 Stiffness of unspecified knee, not elsewhere classified: Secondary | ICD-10-CM | POA: Insufficient documentation

## 2012-01-10 DIAGNOSIS — M25569 Pain in unspecified knee: Secondary | ICD-10-CM | POA: Insufficient documentation

## 2012-01-10 DIAGNOSIS — IMO0001 Reserved for inherently not codable concepts without codable children: Secondary | ICD-10-CM | POA: Insufficient documentation

## 2012-07-12 ENCOUNTER — Ambulatory Visit (HOSPITAL_COMMUNITY)
Admission: RE | Admit: 2012-07-12 | Discharge: 2012-07-12 | Disposition: A | Discharge status: Home / Self Care | Payer: Medicare Other | Source: Ambulatory Visit | Attending: Orthopedic Surgery | Admitting: Orthopedic Surgery

## 2012-07-12 ENCOUNTER — Other Ambulatory Visit (HOSPITAL_COMMUNITY): Payer: Self-pay | Admitting: Orthopedic Surgery

## 2012-07-12 DIAGNOSIS — M25569 Pain in unspecified knee: Secondary | ICD-10-CM | POA: Insufficient documentation

## 2012-07-12 DIAGNOSIS — R52 Pain, unspecified: Secondary | ICD-10-CM

## 2012-07-12 DIAGNOSIS — M899 Disorder of bone, unspecified: Secondary | ICD-10-CM | POA: Insufficient documentation

## 2013-03-29 ENCOUNTER — Ambulatory Visit (HOSPITAL_COMMUNITY)
Admission: RE | Admit: 2013-03-29 | Discharge: 2013-03-29 | Disposition: A | Discharge status: Home / Self Care | Payer: Medicare Other | Source: Ambulatory Visit | Attending: Orthopedic Surgery | Admitting: Orthopedic Surgery

## 2013-03-29 ENCOUNTER — Other Ambulatory Visit (HOSPITAL_COMMUNITY): Payer: Self-pay | Admitting: Orthopedic Surgery

## 2013-03-29 DIAGNOSIS — R52 Pain, unspecified: Secondary | ICD-10-CM

## 2013-03-29 DIAGNOSIS — M545 Low back pain, unspecified: Secondary | ICD-10-CM | POA: Insufficient documentation

## 2013-03-29 DIAGNOSIS — M25569 Pain in unspecified knee: Secondary | ICD-10-CM | POA: Insufficient documentation

## 2013-03-29 DIAGNOSIS — M899 Disorder of bone, unspecified: Secondary | ICD-10-CM | POA: Insufficient documentation

## 2013-03-29 DIAGNOSIS — Z981 Arthrodesis status: Secondary | ICD-10-CM | POA: Insufficient documentation

## 2013-03-29 DIAGNOSIS — M25579 Pain in unspecified ankle and joints of unspecified foot: Secondary | ICD-10-CM | POA: Insufficient documentation

## 2013-03-29 DIAGNOSIS — M79609 Pain in unspecified limb: Secondary | ICD-10-CM | POA: Insufficient documentation

## 2013-03-29 DIAGNOSIS — Z9181 History of falling: Secondary | ICD-10-CM | POA: Insufficient documentation

## 2013-11-08 ENCOUNTER — Ambulatory Visit (HOSPITAL_COMMUNITY)
Admission: RE | Admit: 2013-11-08 | Discharge: 2013-11-08 | Disposition: A | Discharge status: Home / Self Care | Payer: Medicare Other | Source: Ambulatory Visit | Attending: Orthopedic Surgery | Admitting: Orthopedic Surgery

## 2013-11-08 ENCOUNTER — Other Ambulatory Visit (HOSPITAL_COMMUNITY): Payer: Self-pay | Admitting: Orthopedic Surgery

## 2013-11-08 DIAGNOSIS — M7580 Other shoulder lesions, unspecified shoulder: Principal | ICD-10-CM

## 2013-11-08 DIAGNOSIS — M25819 Other specified joint disorders, unspecified shoulder: Secondary | ICD-10-CM | POA: Insufficient documentation

## 2013-11-08 DIAGNOSIS — M7542 Impingement syndrome of left shoulder: Secondary | ICD-10-CM

## 2014-04-05 ENCOUNTER — Other Ambulatory Visit (HOSPITAL_COMMUNITY): Payer: Self-pay | Admitting: Pain Medicine

## 2014-04-05 DIAGNOSIS — M545 Low back pain, unspecified: Secondary | ICD-10-CM

## 2014-04-19 ENCOUNTER — Ambulatory Visit (HOSPITAL_COMMUNITY)
Admission: RE | Admit: 2014-04-19 | Discharge: 2014-04-19 | Disposition: A | Discharge status: Home / Self Care | Payer: Medicare Other | Source: Ambulatory Visit | Attending: Pain Medicine | Admitting: Pain Medicine

## 2014-04-19 DIAGNOSIS — R209 Unspecified disturbances of skin sensation: Secondary | ICD-10-CM | POA: Insufficient documentation

## 2014-04-19 DIAGNOSIS — M545 Low back pain, unspecified: Secondary | ICD-10-CM | POA: Insufficient documentation

## 2014-04-25 ENCOUNTER — Ambulatory Visit (HOSPITAL_COMMUNITY)
Admission: RE | Admit: 2014-04-25 | Discharge: 2014-04-25 | Disposition: A | Discharge status: Home / Self Care | Payer: Medicare Other | Source: Ambulatory Visit | Attending: Orthopaedic Surgery | Admitting: Orthopaedic Surgery

## 2014-04-25 ENCOUNTER — Other Ambulatory Visit (HOSPITAL_COMMUNITY): Payer: Self-pay | Admitting: Orthopaedic Surgery

## 2014-04-25 DIAGNOSIS — M2390 Unspecified internal derangement of unspecified knee: Secondary | ICD-10-CM | POA: Insufficient documentation

## 2014-04-25 DIAGNOSIS — M25569 Pain in unspecified knee: Secondary | ICD-10-CM | POA: Diagnosis present

## 2014-05-08 ENCOUNTER — Other Ambulatory Visit (HOSPITAL_COMMUNITY): Payer: Self-pay | Admitting: Orthopaedic Surgery

## 2014-05-08 DIAGNOSIS — M2390 Unspecified internal derangement of unspecified knee: Secondary | ICD-10-CM

## 2014-05-08 DIAGNOSIS — M25562 Pain in left knee: Secondary | ICD-10-CM

## 2014-05-14 ENCOUNTER — Ambulatory Visit (HOSPITAL_COMMUNITY): Admission: RE | Admit: 2014-05-14 | Payer: Medicare Other | Source: Ambulatory Visit

## 2014-05-15 ENCOUNTER — Ambulatory Visit (HOSPITAL_COMMUNITY)
Admission: RE | Admit: 2014-05-15 | Discharge: 2014-05-15 | Disposition: A | Discharge status: Home / Self Care | Payer: Medicare Other | Source: Ambulatory Visit | Attending: Orthopaedic Surgery | Admitting: Orthopaedic Surgery

## 2014-05-15 DIAGNOSIS — M25562 Pain in left knee: Secondary | ICD-10-CM | POA: Insufficient documentation

## 2015-02-03 ENCOUNTER — Other Ambulatory Visit: Payer: Self-pay | Admitting: Physician Assistant

## 2015-02-03 ENCOUNTER — Emergency Department (HOSPITAL_COMMUNITY): Payer: Medicare Other

## 2015-02-03 ENCOUNTER — Inpatient Hospital Stay (HOSPITAL_COMMUNITY)
Admission: EM | Admit: 2015-02-03 | Discharge: 2015-02-06 | DRG: 435 | Disposition: A | Discharge status: Home / Self Care | Payer: Medicare Other | Attending: Family Medicine | Admitting: Family Medicine

## 2015-02-03 ENCOUNTER — Encounter (HOSPITAL_COMMUNITY): Payer: Self-pay | Admitting: Emergency Medicine

## 2015-02-03 DIAGNOSIS — K5900 Constipation, unspecified: Secondary | ICD-10-CM | POA: Diagnosis present

## 2015-02-03 DIAGNOSIS — C220 Liver cell carcinoma: Secondary | ICD-10-CM | POA: Diagnosis present

## 2015-02-03 DIAGNOSIS — K838 Other specified diseases of biliary tract: Secondary | ICD-10-CM

## 2015-02-03 DIAGNOSIS — R1011 Right upper quadrant pain: Secondary | ICD-10-CM

## 2015-02-03 DIAGNOSIS — Z833 Family history of diabetes mellitus: Secondary | ICD-10-CM | POA: Diagnosis not present

## 2015-02-03 DIAGNOSIS — F17210 Nicotine dependence, cigarettes, uncomplicated: Secondary | ICD-10-CM | POA: Diagnosis present

## 2015-02-03 DIAGNOSIS — I10 Essential (primary) hypertension: Secondary | ICD-10-CM | POA: Diagnosis present

## 2015-02-03 DIAGNOSIS — Z8249 Family history of ischemic heart disease and other diseases of the circulatory system: Secondary | ICD-10-CM | POA: Diagnosis not present

## 2015-02-03 DIAGNOSIS — K7460 Unspecified cirrhosis of liver: Secondary | ICD-10-CM | POA: Diagnosis present

## 2015-02-03 DIAGNOSIS — R1084 Generalized abdominal pain: Secondary | ICD-10-CM | POA: Diagnosis present

## 2015-02-03 DIAGNOSIS — C801 Malignant (primary) neoplasm, unspecified: Secondary | ICD-10-CM | POA: Diagnosis present

## 2015-02-03 DIAGNOSIS — K831 Obstruction of bile duct: Secondary | ICD-10-CM | POA: Diagnosis present

## 2015-02-03 DIAGNOSIS — R160 Hepatomegaly, not elsewhere classified: Secondary | ICD-10-CM | POA: Diagnosis not present

## 2015-02-03 DIAGNOSIS — Z8619 Personal history of other infectious and parasitic diseases: Secondary | ICD-10-CM

## 2015-02-03 LAB — CBC WITH DIFFERENTIAL/PLATELET
Basophils Absolute: 0 10*3/uL (ref 0.0–0.1)
Basophils Relative: 0 % (ref 0–1)
EOS ABS: 0 10*3/uL (ref 0.0–0.7)
Eosinophils Relative: 0 % (ref 0–5)
HCT: 36.2 % — ABNORMAL LOW (ref 39.0–52.0)
Hemoglobin: 11.7 g/dL — ABNORMAL LOW (ref 13.0–17.0)
Lymphocytes Relative: 14 % (ref 12–46)
Lymphs Abs: 1.6 10*3/uL (ref 0.7–4.0)
MCH: 31.5 pg (ref 26.0–34.0)
MCHC: 32.3 g/dL (ref 30.0–36.0)
MCV: 97.6 fL (ref 78.0–100.0)
MONOS PCT: 9 % (ref 3–12)
Monocytes Absolute: 1 10*3/uL (ref 0.1–1.0)
NEUTROS PCT: 77 % (ref 43–77)
Neutro Abs: 8.4 10*3/uL — ABNORMAL HIGH (ref 1.7–7.7)
PLATELETS: 393 10*3/uL (ref 150–400)
RBC: 3.71 MIL/uL — ABNORMAL LOW (ref 4.22–5.81)
RDW: 18.7 % — ABNORMAL HIGH (ref 11.5–15.5)
WBC: 11 10*3/uL — ABNORMAL HIGH (ref 4.0–10.5)

## 2015-02-03 LAB — URINALYSIS, ROUTINE W REFLEX MICROSCOPIC
Glucose, UA: NEGATIVE mg/dL
Hgb urine dipstick: NEGATIVE
Ketones, ur: NEGATIVE mg/dL
NITRITE: POSITIVE — AB
PH: 5.5 (ref 5.0–8.0)
Protein, ur: 30 mg/dL — AB
SPECIFIC GRAVITY, URINE: 1.025 (ref 1.005–1.030)
UROBILINOGEN UA: 1 mg/dL (ref 0.0–1.0)

## 2015-02-03 LAB — COMPREHENSIVE METABOLIC PANEL
ALT: 95 U/L — ABNORMAL HIGH (ref 17–63)
AST: 629 U/L — AB (ref 15–41)
Albumin: 2.7 g/dL — ABNORMAL LOW (ref 3.5–5.0)
Alkaline Phosphatase: 949 U/L — ABNORMAL HIGH (ref 38–126)
Anion gap: 13 (ref 5–15)
BILIRUBIN TOTAL: 12.9 mg/dL — AB (ref 0.3–1.2)
BUN: 10 mg/dL (ref 6–20)
CHLORIDE: 99 mmol/L — AB (ref 101–111)
CO2: 22 mmol/L (ref 22–32)
CREATININE: 0.55 mg/dL — AB (ref 0.61–1.24)
Calcium: 8.7 mg/dL — ABNORMAL LOW (ref 8.9–10.3)
GFR calc Af Amer: >60 mL/min (ref >60)
Glucose, Bld: 110 mg/dL — ABNORMAL HIGH (ref 65–99)
POTASSIUM: 3.7 mmol/L (ref 3.5–5.1)
Sodium: 134 mmol/L — ABNORMAL LOW (ref 135–145)
Total Protein: 7.6 g/dL (ref 6.5–8.1)

## 2015-02-03 LAB — URINE MICROSCOPIC-ADD ON

## 2015-02-03 LAB — LIPASE, BLOOD: LIPASE: 27 U/L (ref 22–51)

## 2015-02-03 LAB — PROTIME-INR
INR: 1.14 (ref 0.00–1.49)
Prothrombin Time: 14.8 seconds (ref 11.6–15.2)

## 2015-02-03 MED ORDER — METOCLOPRAMIDE HCL 5 MG PO TABS
5.0000 mg | ORAL_TABLET | Freq: Three times a day (TID) | ORAL | Status: DC
  Administered 2015-02-03 – 2015-02-06 (×11): 5 mg via ORAL
  Filled 2015-02-03 (×14): qty 1

## 2015-02-03 MED ORDER — IOHEXOL 300 MG/ML  SOLN
50.0000 mL | Freq: Once | INTRAMUSCULAR | Status: AC | PRN
  Administered 2015-02-03: 50 mL via ORAL

## 2015-02-03 MED ORDER — PREGABALIN 75 MG PO CAPS
150.0000 mg | ORAL_CAPSULE | Freq: Two times a day (BID) | ORAL | Status: DC
  Administered 2015-02-03 – 2015-02-06 (×6): 150 mg via ORAL
  Filled 2015-02-03: qty 2
  Filled 2015-02-03: qty 3
  Filled 2015-02-03 (×4): qty 2

## 2015-02-03 MED ORDER — ONDANSETRON HCL 4 MG/2ML IJ SOLN
4.0000 mg | Freq: Once | INTRAMUSCULAR | Status: AC
  Administered 2015-02-03: 4 mg via INTRAVENOUS
  Filled 2015-02-03: qty 2

## 2015-02-03 MED ORDER — MORPHINE SULFATE 2 MG/ML IJ SOLN
2.0000 mg | INTRAMUSCULAR | Status: DC | PRN
  Administered 2015-02-03 – 2015-02-04 (×5): 4 mg via INTRAVENOUS
  Filled 2015-02-03 (×5): qty 2

## 2015-02-03 MED ORDER — IOHEXOL 300 MG/ML  SOLN
100.0000 mL | Freq: Once | INTRAMUSCULAR | Status: AC | PRN
Start: 2015-02-03 — End: 2015-02-03
  Administered 2015-02-03: 100 mL via INTRAVENOUS

## 2015-02-03 MED ORDER — TIZANIDINE HCL 4 MG PO TABS
4.0000 mg | ORAL_TABLET | Freq: Every day | ORAL | Status: DC
  Administered 2015-02-03: 4 mg via ORAL
  Administered 2015-02-04: 2 mg via ORAL
  Administered 2015-02-05: 4 mg via ORAL
  Filled 2015-02-03 (×4): qty 1

## 2015-02-03 NOTE — ED Notes (Signed)
Patient transported to CT 

## 2015-02-03 NOTE — ED Notes (Signed)
Attempted to call report to [facility] for Rm [id number]. She is unable to take report and will call back when available to take report.

## 2015-02-03 NOTE — Progress Notes (Signed)
Obtained report from [facility] in ED

## 2015-02-03 NOTE — ED Notes (Signed)
Pt being sent over by pcp, was being seen for abd pain. Pt is moderately jaundiced, AST 583, lipase 1007.

## 2015-02-03 NOTE — ED Notes (Signed)
Patient transported to Ultrasound 

## 2015-02-03 NOTE — ED Notes (Signed)
Provided patient a [place] sandwich and water with permission from provider.

## 2015-02-03 NOTE — H&P (Signed)
[facility] History and Physical  Toby Breithaupt Arington HGD:[id number] DOB: 1955/10/13 DOA: 02/03/2015  Referring physician: EDP PCP: Aurelio Brash, PA-C   Chief Complaint: Abdominal pain   HPI: Samuel Massey is a 59 y.o. male sent over to the ED after being seen by his PCP for abdominal pain and jaundice.  Patient has had several months of unexplained weight loss, developing abdominal fullness, and development of generalized abdominal "pressure" like pain.  Nothing makes symptoms better or worse.  He was seen by his PCP today who ran LFTs on the patient which came back markedly abnormal.  Patient was sent to the ED for further evaluation.  Review of Systems: Systems reviewed.  As above, otherwise negative  Past Medical History  Diagnosis Date  . Hypertension   . Back pain, chronic   . Restless leg syndrome   . Colitis    Past Surgical History  Procedure Laterality Date  . Knee surgery    . Rotator cuff repair    . Laminectomy    . Lumbar disc surgery     Social History:  reports that he has been smoking Cigarettes.  He does not have any smokeless tobacco history on file. He reports that he does not drink alcohol or use illicit drugs.  Allergies  Allergen Reactions  . Aspirin Other (See Comments)    "sets stomach on fire"  . Codeine   . Cyclobenzaprine Hcl     No family history on file.   Prior to Admission medications   Medication Sig Start Date End Date Taking? Authorizing Provider  LYRICA 75 MG capsule Take 150 mg by mouth 2 (two) times daily. 01/16/15  Yes Historical Provider, MD  metoCLOPramide (REGLAN) 5 MG tablet Take 1 tablet by mouth 4 (four) times daily - after meals and at bedtime. 02/03/15  Yes Historical Provider, MD  Multiple Vitamin (MULTIVITAMIN WITH MINERALS) TABS tablet Take 1 tablet by mouth daily.   Yes Historical Provider, MD  tiZANidine (ZANAFLEX) 4 MG tablet Take 4 mg by mouth at bedtime. 11/18/14  Yes Historical Provider, MD   Physical  Exam: Filed Vitals:   02/03/15 1923  BP: 143/88  Pulse: 90  Temp: 98.7 F (37.1 C)  Resp: 20    BP 143/88 mmHg  Pulse 90  Temp(Src) 98.7 F (37.1 C) (Oral)  Resp 20  SpO2 97%  General Appearance:    Alert, oriented, no distress, appears stated age  Head:    Normocephalic, atraumatic  Eyes:    PERRL, EOMI, sclera non-icteric        Nose:   Nares without drainage or epistaxis. Mucosa, turbinates normal  Throat:   Moist mucous membranes. Oropharynx without erythema or exudate.  Neck:   Supple. No carotid bruits.  No thyromegaly.  No lymphadenopathy.   Back:     No CVA tenderness, no spinal tenderness  Lungs:     Clear to auscultation bilaterally, without wheezes, rhonchi or rales  Chest wall:    No tenderness to palpitation  Heart:    Regular rate and rhythm without murmurs, gallops, rubs  Abdomen:     Rigid upper abdomen across the abdomen with nodular appearance, TTP without guarding or rebound.  Hepatomegally.  Genitalia:    deferred  Rectal:    deferred  Extremities:   No clubbing, cyanosis or edema.  Pulses:   2+ and symmetric all extremities  Skin:   Skin color, texture, turgor normal, no rashes or lesions  Lymph nodes:   Cervical,  supraclavicular, and axillary nodes normal  Neurologic:   CNII-XII intact. Normal strength, sensation and reflexes      throughout    Labs on Admission:  Basic Metabolic Panel:  Recent Labs Lab 02/03/15 1649  NA 134*  K 3.7  CL 99*  CO2 22  GLUCOSE 110*  BUN 10  CREATININE 0.55*  CALCIUM 8.7*   Liver Function Tests:  Recent Labs Lab 02/03/15 1649  AST 629*  ALT 95*  ALKPHOS 949*  BILITOT 12.9*  PROT 7.6  ALBUMIN 2.7*    Recent Labs Lab 02/03/15 1649  LIPASE 27   No results for input(s): AMMONIA in the last 168 hours. CBC:  Recent Labs Lab 02/03/15 1649  WBC 11.0*  NEUTROABS 8.4*  HGB 11.7*  HCT 36.2*  MCV 97.6  PLT 393   Cardiac Enzymes: No results for input(s): CKTOTAL, CKMB, CKMBINDEX, TROPONINI in  the last 168 hours.  BNP (last 3 results) No results for input(s): PROBNP in the last 8760 hours. CBG: No results for input(s): GLUCAP in the last 168 hours.  Radiological Exams on Admission: Ct Abdomen Pelvis W Contrast  02/03/2015   CLINICAL DATA:  Diffuse abdominal pain for 1 month. Abnormal liver function tests.  EXAM: CT ABDOMEN AND PELVIS WITH CONTRAST  TECHNIQUE: Multidetector CT imaging of the abdomen and pelvis was performed using the standard protocol following bolus administration of intravenous contrast.  CONTRAST:  50 mL OMNIPAQUE IOHEXOL 300 MG/ML SOLN, 100 mL OMNIPAQUE IOHEXOL 300 MG/ML SOLN  COMPARISON:  CT abdomen and pelvis 05/04/2011.  FINDINGS: Lung bases are clear.  No pleural or pericardial effusion.  Innumerable mass lesions are seen throughout the liver. The largest is in the right hepatic lobe measuring 7.4 x 8.4 cm in the axial plane on image 36. The liver has a nodular border and is shrunken. No flow is identified within a markedly dilated portal vein likely due to tumor invasion extending to near the confluence of the splenic and portal veins. Although somewhat poorly seen, there appears to be biliary ductal dilatation. Splenic and gastric varices are noted.  The adrenal glands are unremarkable. The spleen is mildly enlarged measuring 13.3 cm. Although the pancreas is somewhat obscured by likely tumor within the portal vein, the pancreas appears normal. No pancreatic ductal dilatation is identified. There is aortoiliac atherosclerosis without aneurysm. A small volume of abdominal and pelvic ascites is noted. The stomach and small and large bowel appear normal.  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 The bile ducts are poorly seen but likely dilated which could be due to tumor invasion or compression of the common bile duct by tumor.   Aortoiliac atherosclerosis without aneurysm.   Electronically Signed   By: Inge Rise M.D.   On: 02/03/2015 19:33    EKG: Independently reviewed.  Assessment/Plan Principal Problem:   Liver masses Active Problems:   Hyperbilirubinemia   Obstructive jaundice due to cancer   1. Liver masses, hyperbilirubenemia, jaundice - probably [place] according to CT scan 1. Markedly abnormal liver on CT scan with "inumerable" masses, imaging most compatible with [place] 2. Looks like this even extends into the portal vein (will hold off on anticoagulation as a result) 3. PT/INR 4. GI consult in AM for possible biopsy 5. Ordered HCV antibody and HBV antibodies and antigens. 6. Will go ahead and let him eat for now, NPO after midnight  Code Status: Full Code  Family Communication: No family in room Disposition Plan: Admit to inpatient   Time spent: 70 min  GARDNER, JARED M. [facility] Pager [phone]  If 7AM-7PM, please contact the day team taking care of the patient [email or web address] Password [facility] 02/03/2015, 8:34 PM

## 2015-02-03 NOTE — ED Notes (Signed)
Attempted to call report, unable to give report. Will call back in 10 minutes.

## 2015-02-03 NOTE — ED Notes (Signed)
Pt states that he was sent over here by his doctor due to his liver.  Had blood work done earlier and was abnormal.

## 2015-02-04 ENCOUNTER — Other Ambulatory Visit: Payer: Medicare Other

## 2015-02-04 ENCOUNTER — Inpatient Hospital Stay (HOSPITAL_COMMUNITY): Payer: Medicare Other

## 2015-02-04 DIAGNOSIS — R160 Hepatomegaly, not elsewhere classified: Secondary | ICD-10-CM

## 2015-02-04 MED ORDER — FLUMAZENIL 0.5 MG/5ML IV SOLN
INTRAVENOUS | Status: AC
  Filled 2015-02-04: qty 5

## 2015-02-04 MED ORDER — MORPHINE SULFATE 2 MG/ML IJ SOLN
1.0000 mg | INTRAMUSCULAR | Status: DC | PRN
  Administered 2015-02-04 – 2015-02-06 (×10): 1 mg via INTRAVENOUS
  Filled 2015-02-04 (×10): qty 1

## 2015-02-04 MED ORDER — FENTANYL CITRATE (PF) 100 MCG/2ML IJ SOLN
INTRAMUSCULAR | Status: AC | PRN
  Administered 2015-02-04 (×2): 25 ug via INTRAVENOUS

## 2015-02-04 MED ORDER — OXYCODONE HCL 5 MG PO TABS
5.0000 mg | ORAL_TABLET | ORAL | Status: DC | PRN
  Administered 2015-02-04 – 2015-02-06 (×5): 5 mg via ORAL
  Filled 2015-02-04 (×5): qty 1

## 2015-02-04 MED ORDER — CETYLPYRIDINIUM CHLORIDE 0.05 % MT LIQD
7.0000 mL | Freq: Two times a day (BID) | OROMUCOSAL | Status: DC
  Administered 2015-02-04 – 2015-02-05 (×2): 7 mL via OROMUCOSAL

## 2015-02-04 MED ORDER — MIDAZOLAM HCL 2 MG/2ML IJ SOLN
INTRAMUSCULAR | Status: AC | PRN
  Administered 2015-02-04: 0.5 mg via INTRAVENOUS
  Administered 2015-02-04: 1 mg via INTRAVENOUS
  Administered 2015-02-04: 0.5 mg via INTRAVENOUS

## 2015-02-04 MED ORDER — NICOTINE 21 MG/24HR TD PT24
21.0000 mg | MEDICATED_PATCH | Freq: Every day | TRANSDERMAL | Status: DC
Start: 2015-02-04 — End: 2015-02-06
  Administered 2015-02-04 – 2015-02-06 (×3): 21 mg via TRANSDERMAL
  Filled 2015-02-04 (×3): qty 1

## 2015-02-04 MED ORDER — FENTANYL CITRATE (PF) 100 MCG/2ML IJ SOLN
INTRAMUSCULAR | Status: AC
  Filled 2015-02-04: qty 2

## 2015-02-04 MED ORDER — NALOXONE HCL 0.4 MG/ML IJ SOLN
INTRAMUSCULAR | Status: AC
  Filled 2015-02-04: qty 1

## 2015-02-04 MED ORDER — MIDAZOLAM HCL 2 MG/2ML IJ SOLN
INTRAMUSCULAR | Status: AC
  Filled 2015-02-04: qty 4

## 2015-02-04 MED ORDER — CHLORHEXIDINE GLUCONATE 0.12 % MT SOLN
15.0000 mL | Freq: Two times a day (BID) | OROMUCOSAL | Status: DC
  Administered 2015-02-04 – 2015-02-06 (×5): 15 mL via OROMUCOSAL
  Filled 2015-02-04 (×7): qty 15

## 2015-02-04 MED ORDER — OXYCODONE HCL ER 20 MG PO T12A
20.0000 mg | EXTENDED_RELEASE_TABLET | Freq: Two times a day (BID) | ORAL | Status: DC
  Administered 2015-02-04 – 2015-02-06 (×4): 20 mg via ORAL
  Filled 2015-02-04 (×4): qty 1

## 2015-02-04 NOTE — Consult Note (Signed)
Chief Complaint: Chief Complaint  Patient presents with  . sent by PCP   . Jaundice  . abnormal labs    Referring Physician(s): [facility]  History of Present Illness: Samuel Massey is a 59 y.o. male recently admitted with history of abdominal pain, constipation, weight loss, jaundice, elevated LFT's and CT revealing innumerable liver masses/cirrhosis suspicious for HCC. Request now received for US guided liver lesion biopsy.   Past Medical History  Diagnosis Date  . Hypertension   . Back pain, chronic   . Restless leg syndrome   . Colitis     Past Surgical History  Procedure Laterality Date  . Knee surgery Left   . Rotator cuff repair Left   . Laminectomy    . Lumbar disc surgery      Allergies: Aspirin; Codeine; and Cyclobenzaprine hcl  Medications: Prior to Admission medications   Medication Sig Start Date End Date Taking? Authorizing Provider  LYRICA 75 MG capsule Take 150 mg by mouth 2 (two) times daily. 01/16/15  Yes Historical Provider, MD  metoCLOPramide (REGLAN) 5 MG tablet Take 1 tablet by mouth 4 (four) times daily - after meals and at bedtime. 02/03/15  Yes Historical Provider, MD  Multiple Vitamin (MULTIVITAMIN WITH MINERALS) TABS tablet Take 1 tablet by mouth daily.   Yes Historical Provider, MD  tiZANidine (ZANAFLEX) 4 MG tablet Take 4 mg by mouth at bedtime. 11/18/14  Yes Historical Provider, MD     Family History  Problem Relation Age of Onset  . Diabetes Mother   . Hypertension Mother   . Hypertension Father     History   Social History  . Marital Status: Single    Spouse Name: N/A  . Number of Children: N/A  . Years of Education: N/A   Social History Main Topics  . Smoking status: [place] Every Day Smoker -- 1.00 packs/day for 42 years    Types: Cigarettes  . Smokeless tobacco: Not on file  . Alcohol Use: No  . Drug Use: No  . Sexual Activity: Not on file   Other Topics Concern  . None   Social History Narrative      Review  of Systems see above  Vital Signs: BP 140/99 mmHg  Pulse 89  Temp(Src) 98.6 F (37 C) (Oral)  Resp 20  Ht 6' (1.829 m)  Wt 190 lb (86.183 kg)  BMI 25.76 kg/m2  SpO2 95%  Physical Exam pt awake/alert; jaundiced; chest- CTA bilat; heart- occ ectopy noted; abd- dist,tender epigastric region with hepatomegaly,+BS; ext- FROM, no edema  Mallampati Score:     Imaging: Ct Abdomen Pelvis W Contrast  02/03/2015   CLINICAL DATA:  Diffuse abdominal pain for 1 month. Abnormal liver function tests.  EXAM: CT ABDOMEN AND PELVIS WITH CONTRAST  TECHNIQUE: Multidetector CT imaging of the abdomen and pelvis was performed using the standard protocol following bolus administration of intravenous contrast.  CONTRAST:  50 mL OMNIPAQUE IOHEXOL 300 MG/ML SOLN, 100 mL OMNIPAQUE IOHEXOL 300 MG/ML SOLN  COMPARISON:  CT abdomen and pelvis 05/04/2011.  FINDINGS: Lung bases are clear.  No pleural or pericardial effusion.  Innumerable mass lesions are seen throughout the liver. The largest is in the right hepatic lobe measuring 7.4 x 8.4 cm in the axial plane on image 36. The liver has a nodular border and is shrunken. No flow is identified within a markedly dilated portal vein likely due to tumor invasion extending to near the confluence of the splenic and portal  veins. Although somewhat poorly seen, there appears to be biliary ductal dilatation. Splenic and gastric varices are noted.  The adrenal glands are unremarkable. The spleen is mildly enlarged measuring 13.3 cm. Although the pancreas is somewhat obscured by likely tumor within the portal vein, the pancreas appears normal. No pancreatic ductal dilatation is identified. There is aortoiliac atherosclerosis without aneurysm. A small volume of abdominal and pelvic ascites is noted. The stomach and small and large bowel appear normal.  IMPRESSION: Innumerable masses within the liver and likely tumor extension into the portal vein in this patient with cirrhosis is likely due  to multifocal hepatic cellular carcinoma. The bile ducts are poorly seen but likely dilated which could be due to tumor invasion or compression of the common bile duct by tumor.  Aortoiliac atherosclerosis without aneurysm.   Electronically Signed   By: Inge Rise M.D.   On: 02/03/2015 19:33    Labs:  CBC:  Recent Labs  02/03/15 1649  WBC 11.0*  HGB 11.7*  HCT 36.2*  PLT 393    COAGS:  Recent Labs  02/03/15 2037  INR 1.14    BMP:  Recent Labs  02/03/15 1649  NA 134*  K 3.7  CL 99*  CO2 22  GLUCOSE 110*  BUN 10  CALCIUM 8.7*  CREATININE 0.55*  GFRNONAA >60  GFRAA >60    LIVER FUNCTION TESTS:  Recent Labs  02/03/15 1649  BILITOT 12.9*  AST 629*  ALT 95*  ALKPHOS 949*  PROT 7.6  ALBUMIN 2.7*    TUMOR MARKERS: No results for input(s): AFPTM, CEA, CA199, CHROMGRNA in the last 8760 hours.  Assessment and Plan: Samuel Massey is a 59 y.o. male recently admitted with history of abdominal pain, constipation, weight loss, jaundice, elevated LFT's and CT revealing innumerable liver masses/cirrhosis suspicious for HCC. Request now received for US guided liver lesion biopsy. Risks and benefits discussed with the patient including, but not limited to bleeding, infection, damage to adjacent structures or low yield requiring additional tests. All of the patient's questions were answered, patient is agreeable to proceed.Consent signed and in chart. Procedure tent planned for later today.      Signed: D. Rowe Robert 02/04/2015, 1:08 PM   I spent a total of 20 minutes in face to face in clinical consultation, greater than 50% of which was counseling/coordinating care for US guided liver lesion biopsy

## 2015-02-04 NOTE — Plan of Care (Signed)
Problem: Phase I Progression Outcomes Goal: Pain controlled with appropriate interventions Outcome: Progressing Prn morphine does decrease pain level.

## 2015-02-04 NOTE — Progress Notes (Signed)
Pt non-compliant with bedrest. Pt has been educated on need to stay in bed after his procedure and has been encouraged to call staff for help instead of him getting out of bed and doing things himself. Pt has already been found 2x up in the bathroom

## 2015-02-04 NOTE — Progress Notes (Signed)
Pt denied offer for spiritual consult. Samuel Massey he would consider it within a few days

## 2015-02-04 NOTE — Progress Notes (Signed)
Admitted to room 1334 from ED with new liver cancer. Oriented to room and unit.

## 2015-02-04 NOTE — Progress Notes (Signed)
[facility] PROGRESS NOTE  Bralyn Folkert Sunga VFI:[id number] DOB: 12/11/1955 DOA: 02/03/2015 PCP: Aurelio Brash, PA-C  Assessment/Plan:  Principal Problem:   Liver masses - Consulted IR for liver biopsy. Status post biopsy - Most likely cause of abdominal discomfort. We'll plan on transitioning from IV pain medication regimen to oral pain regimen.  Active Problems:    Obstructive jaundice due to cancer - stable, consult GI in the AM.   Code Status: full Family Communication:  None at bedside Disposition Plan: Pending improvement in condition   Consultants:  IR  Procedures:  Liver biopsy  Antibiotics:  None  HPI/Subjective: Pt has no new complaints other than abodminal discomfort.  Objective: Filed Vitals:   02/04/15 1638  BP: 157/97  Pulse: 100  Temp: 97.5 F (36.4 C)  Resp: 18    Intake/Output Summary (Last 24 hours) at 02/04/15 1649 Last data filed at 02/03/15 2330  Gross per 24 hour  Intake     60 ml  Output      0 ml  Net     60 ml   Filed Weights   02/03/15 2220  Weight: 86.183 kg (190 lb)    Exam:   General:  Pt in nad, alert and awake  Cardiovascular: rrr, no mrg  Respiratory: cta bl, no wheezes  Abdomen: soft, no guarding  Musculoskeletal: no cyanosis or clubbing   Data Reviewed: Basic Metabolic Panel:  Recent Labs Lab 02/03/15 1649  NA 134*  K 3.7  CL 99*  CO2 22  GLUCOSE 110*  BUN 10  CREATININE 0.55*  CALCIUM 8.7*   Liver Function Tests:  Recent Labs Lab 02/03/15 1649  AST 629*  ALT 95*  ALKPHOS 949*  BILITOT 12.9*  PROT 7.6  ALBUMIN 2.7*    Recent Labs Lab 02/03/15 1649  LIPASE 27   No results for input(s): AMMONIA in the last 168 hours. CBC:  Recent Labs Lab 02/03/15 1649  WBC 11.0*  NEUTROABS 8.4*  HGB 11.7*  HCT 36.2*  MCV 97.6  PLT 393   Cardiac Enzymes: No results for input(s): CKTOTAL, CKMB, CKMBINDEX, TROPONINI in the last 168 hours. BNP (last 3 results) No results for  input(s): BNP in the last 8760 hours.  ProBNP (last 3 results) No results for input(s): PROBNP in the last 8760 hours.  CBG: No results for input(s): GLUCAP in the last 168 hours.  Recent Results (from the past 240 hour(s))  Urine culture     Status: None (Preliminary result)   Collection Time: 02/03/15  3:28 PM  Result Value Ref Range Status   Specimen Description URINE, CLEAN CATCH  Final   Special Requests NONE  Final   Culture   Final    NO GROWTH < 24 HOURS Performed at [facility]    Report Status PENDING  Incomplete     Studies: Ct Abdomen Pelvis W Contrast  02/03/2015   CLINICAL DATA:  Diffuse abdominal pain for 1 month. Abnormal liver function tests.  EXAM: CT ABDOMEN AND PELVIS WITH CONTRAST  TECHNIQUE: Multidetector CT imaging of the abdomen and pelvis was performed using the standard protocol following bolus administration of intravenous contrast.  CONTRAST:  50 mL OMNIPAQUE IOHEXOL 300 MG/ML SOLN, 100 mL OMNIPAQUE IOHEXOL 300 MG/ML SOLN  COMPARISON:  CT abdomen and pelvis 05/04/2011.  FINDINGS: Lung bases are clear.  No pleural or pericardial effusion.  Innumerable mass lesions are seen throughout the liver. The largest is in the right hepatic lobe measuring 7.4 x 8.4 cm  in the axial plane on image 36. The liver has a nodular border and is shrunken. No flow is identified within a markedly dilated portal vein likely due to tumor invasion extending to near the confluence of the splenic and portal veins. Although somewhat poorly seen, there appears to be biliary ductal dilatation. Splenic and gastric varices are noted.  The adrenal glands are unremarkable. The spleen is mildly enlarged measuring 13.3 cm. Although the pancreas is somewhat obscured by likely tumor within the portal vein, the pancreas appears normal. No pancreatic ductal dilatation is identified. There is aortoiliac atherosclerosis without aneurysm. A small volume of abdominal and pelvic ascites is noted. The  stomach and small and large bowel appear normal.  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 The bile ducts are poorly seen but likely dilated which could be due to tumor invasion or compression of the common bile duct by tumor.  Aortoiliac atherosclerosis without aneurysm.   Electronically Signed   By: Inge Rise M.D.   On: 02/03/2015 19:33    Scheduled Meds: . antiseptic oral rinse  7 mL Mouth Rinse q12n4p  . chlorhexidine  15 mL Mouth Rinse BID  . metoCLOPramide  5 mg Oral TID PC & HS  . OxyCODONE  20 mg Oral Q12H  . pregabalin  150 mg Oral BID  . tiZANidine  4 mg Oral QHS   Continuous Infusions:    Time spent: > 35 minutes   Velvet Bathe  [facility] Pager [phone] If 7PM-7AM, please contact night-coverage at [email or web address], password [facility] 02/04/2015, 4:49 PM  LOS: 1 day

## 2015-02-04 NOTE — Progress Notes (Signed)
Initial Nutrition Assessment  DOCUMENTATION CODES:  Non-severe (moderate) malnutrition in context of acute illness/injury  INTERVENTION:  Diet advancement per MD When diet is advanced, recommend Ensure Enlive po BID, each supplement provides 350 kcal and 20 grams of protein RD to continue to monitor  NUTRITION DIAGNOSIS:  Malnutrition related to acute illness as evidenced by percent weight loss, mild depletion of muscle mass.  GOAL:  Patient will meet greater than or equal to 90% of their needs  MONITOR:  Diet advancement, Labs, Weight trends, Skin, I & O's  REASON FOR ASSESSMENT:  Malnutrition Screening Tool    ASSESSMENT: 59 y.o. male sent over to the ED after being seen by his PCP for abdominal pain and jaundice. Patient has had several months of unexplained weight loss, developing abdominal fullness, and development of generalized abdominal "pressure" like pain.  Pt currently NPO and waiting to have biopsy today. Pt states he feels really hungry. Pt states that he has had good appetite and eating well PTA. Pt has had 30 lb of weight loss over the last 2-3 months (14% weight loss x 2-3 months, significant for time frame).  Pt would like to receive Ensure supplements once diet is advanced.  Nutrition-Focused physical exam completed. Findings are no fat depletion, mild muscle depletion, and mild edema.   Labs reviewed: Low Na, Creatinine Elevated LFTs  Height:  Ht Readings from Last 1 Encounters:  02/03/15 6' (1.829 m)    Weight:  Wt Readings from Last 1 Encounters:  02/03/15 190 lb (86.183 kg)    Ideal Body Weight:  80.9 kg  Wt Readings from Last 10 Encounters:  02/03/15 190 lb (86.183 kg)    BMI:  Body mass index is 25.76 kg/(m^2).  Estimated Nutritional Needs:  Kcal:  2100-2300  Protein:  115-125g  Fluid:  2.1L/day    Skin:  Reviewed, no issues  Diet Order:  Diet NPO time specified  EDUCATION NEEDS:  No education needs identified at  this time   Intake/Output Summary (Last 24 hours) at 02/04/15 1045 Last data filed at 02/03/15 2330  Gross per 24 hour  Intake     60 ml  Output      0 ml  Net     60 ml    Last BM:  6/27  Clayton Bibles, MS, RD, LDN Pager: [phone] After Hours Pager: [phone]

## 2015-02-04 NOTE — Procedures (Signed)
US guided core biopsies of left hepatic lesion.  3 cores obtained.  Gelfoam used for hemostasis.  No immediate complication.  Minimal blood loss.

## 2015-02-05 LAB — HEPATITIS B SURFACE ANTIGEN: Hepatitis B Surface Ag: NEGATIVE

## 2015-02-05 LAB — HEPATITIS C ANTIBODY: HCV Ab: >11 s/co ratio — ABNORMAL HIGH (ref 0.0–0.9)

## 2015-02-05 LAB — HEPATITIS B CORE ANTIBODY, IGM: Hep B C IgM: NEGATIVE

## 2015-02-05 LAB — HEPATITIS B SURFACE ANTIBODY,QUALITATIVE: Hep B S Ab: NONREACTIVE

## 2015-02-05 LAB — URINE CULTURE: Culture: NO GROWTH

## 2015-02-05 LAB — HEPATITIS B E ANTIBODY: Hep B E Ab: NEGATIVE

## 2015-02-05 LAB — HEPATITIS B E ANTIGEN: Hep B E Ag: NEGATIVE

## 2015-02-05 MED ORDER — SODIUM CHLORIDE 0.9 % IV BOLUS (SEPSIS)
500.0000 mL | Freq: Once | INTRAVENOUS | Status: AC
  Administered 2015-02-05: 500 mL via INTRAVENOUS

## 2015-02-05 MED ORDER — IBUPROFEN 200 MG PO TABS: 400.0000 mg | ORAL_TABLET | Freq: Once | ORAL | Status: DC

## 2015-02-05 MED ORDER — ENSURE ENLIVE PO LIQD
237.0000 mL | Freq: Two times a day (BID) | ORAL | Status: DC
  Administered 2015-02-05 – 2015-02-06 (×4): 237 mL via ORAL

## 2015-02-05 NOTE — Progress Notes (Signed)
[facility] PROGRESS NOTE  Samuel Massey [id number] DOB: January 28, 1956 DOA: 02/03/2015 PCP: Aurelio Brash, PA-C  Assessment/Plan:  Principal Problem:   Liver masses - Status post biopsy - GI consulted for recommendations regarding elevated T bilirubin  Active Problems:    Obstructive jaundice due to cancer - stable, consult GI in the AM.   Code Status: full Family Communication:  None at bedside Disposition Plan: Pending improvement in condition   Consultants:  IR  Procedures:  Liver biopsy  Antibiotics:  None  HPI/Subjective: Pt has no new complaints other than abodminal discomfort.  Objective: Filed Vitals:   02/05/15 1421  BP: 129/87  Pulse: 99  Temp: 99.3 F (37.4 C)  Resp:     Intake/Output Summary (Last 24 hours) at 02/05/15 1651 Last data filed at 02/05/15 1400  Gross per 24 hour  Intake   1490 ml  Output   1050 ml  Net    440 ml   Filed Weights   02/03/15 2220  Weight: 86.183 kg (190 lb)    Exam:   General:  Pt in nad, alert and awake  Cardiovascular: rrr, no mrg  Respiratory: cta bl, no wheezes  Abdomen: soft, no guarding  Musculoskeletal: no cyanosis or clubbing   Data Reviewed: Basic Metabolic Panel:  Recent Labs Lab 02/03/15 1649  NA 134*  K 3.7  CL 99*  CO2 22  GLUCOSE 110*  BUN 10  CREATININE 0.55*  CALCIUM 8.7*   Liver Function Tests:  Recent Labs Lab 02/03/15 1649  AST 629*  ALT 95*  ALKPHOS 949*  BILITOT 12.9*  PROT 7.6  ALBUMIN 2.7*    Recent Labs Lab 02/03/15 1649  LIPASE 27   No results for input(s): AMMONIA in the last 168 hours. CBC:  Recent Labs Lab 02/03/15 1649  WBC 11.0*  NEUTROABS 8.4*  HGB 11.7*  HCT 36.2*  MCV 97.6  PLT 393   Cardiac Enzymes: No results for input(s): CKTOTAL, CKMB, CKMBINDEX, TROPONINI in the last 168 hours. BNP (last 3 results) No results for input(s): BNP in the last 8760 hours.  ProBNP (last 3 results) No results for input(s): PROBNP  in the last 8760 hours.  CBG: No results for input(s): GLUCAP in the last 168 hours.  Recent Results (from the past 240 hour(s))  Urine culture     Status: None   Collection Time: 02/03/15  3:28 PM  Result Value Ref Range Status   Specimen Description URINE, CLEAN CATCH  Final   Special Requests NONE  Final   Culture   Final    NO GROWTH 2 DAYS Performed at [facility]    Report Status 02/05/2015 FINAL  Final     Studies: Ct Abdomen Pelvis W Contrast  02/03/2015   CLINICAL DATA:  Diffuse abdominal pain for 1 month. Abnormal liver function tests.  EXAM: CT ABDOMEN AND PELVIS WITH CONTRAST  TECHNIQUE: Multidetector CT imaging of the abdomen and pelvis was performed using the standard protocol following bolus administration of intravenous contrast.  CONTRAST:  50 mL OMNIPAQUE IOHEXOL 300 MG/ML SOLN, 100 mL OMNIPAQUE IOHEXOL 300 MG/ML SOLN  COMPARISON:  CT abdomen and pelvis 05/04/2011.  FINDINGS: Lung bases are clear.  No pleural or pericardial effusion.  Innumerable mass lesions are seen throughout the liver. The largest is in the right hepatic lobe measuring 7.4 x 8.4 cm in the axial plane on image 36. The liver has a nodular border and is shrunken. No flow is identified within a markedly dilated  portal vein likely due to tumor invasion extending to near the confluence of the splenic and portal veins. Although somewhat poorly seen, there appears to be biliary ductal dilatation. Splenic and gastric varices are noted.  The adrenal glands are unremarkable. The spleen is mildly enlarged measuring 13.3 cm. Although the pancreas is somewhat obscured by likely tumor within the portal vein, the pancreas appears normal. No pancreatic ductal dilatation is identified. There is aortoiliac atherosclerosis without aneurysm. A small volume of abdominal and pelvic ascites is noted. The stomach and small and large bowel appear normal.  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 The bile ducts are poorly seen but likely dilated which could be due to tumor invasion or compression of the common bile duct by tumor.  Aortoiliac atherosclerosis without aneurysm.   Electronically Signed   By: Inge Rise M.D.   On: 02/03/2015 19:33   US Biopsy  02/04/2015   CLINICAL DATA:  59 year old with jaundice and multiple liver lesions. Tissue diagnosis is needed.  EXAM: ULTRASOUND-GUIDED LIVER LESION BIOPSY  Physician: Stephan Minister. Anselm Pancoast, MD  FLUOROSCOPY TIME:  None  MEDICATIONS: 2 mg Versed, 50 mcg fentanyl. A radiology nurse monitored the patient for moderate sedation.  ANESTHESIA/SEDATION: Moderate sedation time: 18 minutes  PROCEDURE: The procedure was explained to the patient. The risks and benefits of the procedure were discussed and the patient's questions were addressed. Informed consent was obtained from the patient. The liver was evaluated with ultrasound. Lesion along the inferior left hepatic lobe was selected for biopsy. The anterior abdomen was prepped and draped in sterile fashion. Skin was anesthetized with 1% lidocaine. 69 gauge needle directed into the left hepatic lesion with ultrasound guidance. Needle position confirmed within the lesion. Three core biopsies were obtained with an 18 gauge core device. Gel-Foam plugs was injected through the 17 gauge needle for hemostasis. Bandage placed over the puncture site.  FINDINGS: Multiple solid hepatic lesions. Small amount of ascites along the right hepatic lobe. Core biopsies obtained from a left inferior hepatic lesion.  Estimated blood loss: Minimal  COMPLICATIONS: None  IMPRESSION: Ultrasound-guided core biopsies of a left hepatic lesion.   Electronically Signed   By: Markus Daft M.D.   On: 02/04/2015 16:55    Scheduled Meds: . antiseptic oral rinse  7 mL Mouth Rinse q12n4p  . chlorhexidine  15 mL Mouth Rinse BID  .  feeding supplement (ENSURE ENLIVE)  237 mL Oral BID BM  . ibuprofen  400 mg Oral Once  . metoCLOPramide  5 mg Oral TID PC & HS  . nicotine  21 mg Transdermal Daily  . OxyCODONE  20 mg Oral Q12H  . pregabalin  150 mg Oral BID  . tiZANidine  4 mg Oral QHS   Continuous Infusions:    Time spent: > 35 minutes   Velvet Bathe  [facility] Pager [phone] If 7PM-7AM, please contact night-coverage at [email or web address], password [facility] 02/05/2015, 4:51 PM  LOS: 2 days

## 2015-02-05 NOTE — Consult Note (Signed)
Consultation  Referring Provider:  [facility]    Primary Care Physician:  Aurelio Brash, PA-C Primary Gastroenterologist: none        Reason for Consultation: liver lesions              HPI:   Samuel Massey is a 59 y.o. male who presented to ED 2 days ago for abdominal pain, abnormal LFTs. CTscan suggests cirrhosis and several liver masses with probable tumor extension into portal vein. Normal pancreas. Bile ducts poorly seen but "likely dilated". 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 No significant ETOH in 30 years.   Patient began having diffuse abdominal pain a couple of months ago. He has lost weight.   Past Medical History  Diagnosis Date  . Hypertension   . Back pain, chronic   . Restless leg syndrome   . Colitis     Past Surgical History  Procedure Laterality Date  . Knee surgery Left   . Rotator cuff repair Left   . Laminectomy    . Lumbar disc surgery      Family History  Problem Relation Age of Onset  . Diabetes Mother   . Hypertension Mother   . Hypertension Father      History  Substance Use Topics  . Smoking status: [place] Every Day Smoker -- 1.00 packs/day for 42 years    Types: Cigarettes  . Smokeless tobacco: Not on file  . Alcohol Use: No    Prior to Admission medications   Medication Sig Start Date End Date Taking? Authorizing Provider  LYRICA 75 MG capsule Take 150 mg by mouth 2 (two) times daily. 01/16/15  Yes Historical Provider, MD  metoCLOPramide (REGLAN) 5 MG tablet Take 1 tablet by mouth 4 (four) times daily - after meals and at bedtime. 02/03/15  Yes Historical Provider, MD  Multiple Vitamin (MULTIVITAMIN WITH MINERALS) TABS tablet Take 1 tablet by mouth daily.   Yes Historical Provider, MD  tiZANidine (ZANAFLEX) 4 MG tablet Take 4 mg by mouth at bedtime. 11/18/14  Yes Historical Provider, MD    [facility] Facility-Administered Medications  Medication Dose Route Frequency Provider Last Rate Last Dose    . antiseptic oral rinse (CPC / CETYLPYRIDINIUM CHLORIDE 0.05%) solution 7 mL  7 mL Mouth Rinse q12n4p Etta Quill, DO   7 mL at 02/04/15 1600  . chlorhexidine (PERIDEX) 0.12 % solution 15 mL  15 mL Mouth Rinse BID Etta Quill, DO   15 mL at 02/05/15 0740  . feeding supplement (ENSURE ENLIVE) (ENSURE ENLIVE) liquid 237 mL  237 mL Oral BID BM Clayton Bibles, RD   237 mL at 02/05/15 0914  . ibuprofen (ADVIL,MOTRIN) tablet 400 mg  400 mg Oral Once Jeryl Columbia, NP   Stopped at 02/05/15 0515  . metoCLOPramide (REGLAN) tablet 5 mg  5 mg Oral TID PC & HS Etta Quill, DO   5 mg at 02/05/15 0914  . morphine 2 MG/ML injection 1 mg  1 mg Intravenous Q4H PRN Velvet Bathe, MD   1 mg at 02/05/15 0915  . nicotine (NICODERM CQ - dosed in mg/24 hours) patch 21 mg  21 mg Transdermal Daily Rhetta Mura Schorr, NP   21 mg at 02/05/15 0915  . oxyCODONE (Oxy IR/ROXICODONE) immediate release tablet 5 mg  5 mg Oral Q4H PRN Velvet Bathe, MD   5 mg at 02/04/15 1953  . OxyCODONE (OXYCONTIN) 12 hr tablet 20 mg  20 mg Oral Q12H Velvet Bathe, MD   20 mg at 02/05/15 0914  . pregabalin (LYRICA) capsule 150 mg  150 mg Oral BID Etta Quill, DO   150 mg at 02/05/15 1779  . tiZANidine (ZANAFLEX) tablet 4 mg  4 mg Oral QHS Etta Quill, DO   2 mg at 02/04/15 2113    Allergies as of 02/03/2015 - Review Complete 02/03/2015  Allergen Reaction Noted  . Aspirin Other (See Comments) 02/03/2015  . Codeine  07/21/2009  . Cyclobenzaprine hcl  07/21/2009     Review of Systems:    As per HPI, otherwise negative    Physical Exam:  Vital signs in last 24 hours: Temp:  [97.5 F (36.4 C)-100.3 F (37.9 C)] 100.3 F (37.9 C) (06/29 0451) Pulse Rate:  [95-104] 104 (06/29 0451) Resp:  [16-23] 18 (06/29 0451) BP: (123-172)/(71-102) 143/89 mmHg (06/29 0451) SpO2:  [92 %-100 %] 98 % (06/29 0451) Last BM Date: 02/05/15 General:   Pleasant white male in NAD Head:  Normocephalic and atraumatic. Ears:  Normal  auditory acuity. Neck:  Supple Lungs:  Respirations even and unlabored. Lungs clear to auscultation bilaterally.   No wheezes, crackles, or rhonchi.  Heart:  Regular rate and rhythm; no MRG Abdomen:  Soft, RUQ tenderness. RUQ tender mass. Normal bowel sounds. No appreciable masses or hepatomegaly.  Rectal:  Not performed.  Msk:  Symmetrical without gross deformities.  Extremities:  Without edema. Neurologic:  Alert and  oriented x4;  grossly normal neurologically. Skin:  Intact without significant lesions or rashes. Psych:  Alert and cooperative. Normal affect.  LAB RESULTS:  Recent Labs  02/03/15 1649  WBC 11.0*  HGB 11.7*  HCT 36.2*  PLT 393   BMET  Recent Labs  02/03/15 1649  NA 134*  K 3.7  CL 99*  CO2 22  GLUCOSE 110*  BUN 10  CREATININE 0.55*  CALCIUM 8.7*   LFT  Recent Labs  02/03/15 1649  PROT 7.6  ALBUMIN 2.7*  AST 629*  ALT 95*  ALKPHOS 949*  BILITOT 12.9*   PT/INR  Recent Labs  02/03/15 2037  LABPROT 14.8  INR 1.14    PREVIOUS ENDOSCOPIES:            none   Impression / Plan:   59 year old male with abdominal pain and abnormal LFTs  CTscan suggests multifocal HCC in setting of cirrhosis. There is probable tumor invasion into portal vein. Cirrhosis possible secondary to HCV as antibody is positive. He is s/p US guided biopsy of left liver lesion yesterday. Pain controlled at present. Awaiting biopsy results. He may need biliary stent placement to relieve obstruction  Thanks   LOS: 2 days   Tye Savoy  02/05/2015, 10:09 AM

## 2015-02-06 ENCOUNTER — Inpatient Hospital Stay (HOSPITAL_COMMUNITY): Payer: Medicare Other

## 2015-02-06 ENCOUNTER — Telehealth: Payer: Self-pay | Admitting: Oncology

## 2015-02-06 ENCOUNTER — Other Ambulatory Visit: Payer: Self-pay | Admitting: *Deleted

## 2015-02-06 DIAGNOSIS — C220 Liver cell carcinoma: Secondary | ICD-10-CM

## 2015-02-06 LAB — CANCER ANTIGEN 19-9: CA 19 9: 161 U/mL — AB (ref 0–35)

## 2015-02-06 LAB — AFP TUMOR MARKER: AFP-Tumor Marker: 5114 ng/mL — ABNORMAL HIGH (ref 0.0–8.3)

## 2015-02-06 MED ORDER — SENNA 8.6 MG PO TABS: 1.0000 | ORAL_TABLET | Freq: Every day | ORAL | Status: AC | PRN

## 2015-02-06 MED ORDER — OXYCODONE HCL ER 20 MG PO T12A: 20.0000 mg | EXTENDED_RELEASE_TABLET | Freq: Two times a day (BID) | ORAL | Status: AC

## 2015-02-06 MED ORDER — OXYCODONE HCL 5 MG PO TABS: 5.0000 mg | ORAL_TABLET | ORAL | Status: DC | PRN

## 2015-02-06 MED ORDER — SENNA 8.6 MG PO TABS
1.0000 | ORAL_TABLET | Freq: Every day | ORAL | Status: DC | PRN
  Administered 2015-02-06: 8.6 mg via ORAL
  Filled 2015-02-06: qty 1

## 2015-02-06 NOTE — Progress Notes (Signed)
Pt discharged home in stable condition. Discharge instructions and scripts given. Pt verbalized understanding 

## 2015-02-06 NOTE — Discharge Summary (Signed)
Physician Discharge Summary  Samuel Massey EZM:[id number] DOB: 1955/11/05 DOA: 02/03/2015  PCP: Aurelio Brash, PA-C  Admit date: 02/03/2015 Discharge date: 02/06/2015  Time spent: > 35 minutes  Recommendations for Outpatient Follow-up:  1. Please ensure patient follows up with oncologist and gastroenterologist  Discharge Diagnoses:  Principal Problem:   Liver masses Active Problems:   Hyperbilirubinemia   Obstructive jaundice due to cancer   Discharge Condition: Stable  Diet recommendation: Regular  Filed Weights   02/03/15 2220  Weight: 86.183 kg (190 lb)    History of present illness:  Patient is a 59 year old with history of hepatitis C who presented with abdominal pain and found to have innumerable masses within the liver.  Hospital Course:  Innumerable liver masses - Most likely HCC. Patient has follow-up appointment with oncologist - GI consulted while patient was in house and okay with outpatient evaluation as per their note on 02/06/2015  Procedures:  Ultrasound-guided biopsy of liver  Consultations:  GI  Oncology  Discharge Exam: Filed Vitals:   02/06/15 1418  BP: 123/75  Pulse: 98  Temp: 98.4 F (36.9 C)  Resp: 15    General: Patient has no new complaints. No acute issues overnight Cardiovascular: No cyanosis Respiratory: No increased work of breathing, equal chest rise, no audible wheezes  Discharge Instructions   Discharge Instructions    Call MD for:  difficulty breathing, headache or visual disturbances    Complete by:  As directed      Call MD for:  severe uncontrolled pain    Complete by:  As directed      Call MD for:  temperature >100.4    Complete by:  As directed      Diet - low sodium heart healthy    Complete by:  As directed      Discharge instructions    Complete by:  As directed   F/u with Dr. Benay Spice with oncology for further evaluation and recommendations. Please call their office after d/c to confirm date and  time.     Driving Restrictions    Complete by:  As directed   May not drive while on opiod medication (oxycodone etc)     Increase activity slowly    Complete by:  As directed           [facility] Discharge Medication List    START taking these medications   Details  oxyCODONE (OXY IR/ROXICODONE) 5 MG immediate release tablet Take 1 tablet (5 mg total) by mouth every 4 (four) hours as needed for breakthrough pain. Qty: 30 tablet, Refills: 0    OxyCODONE (OXYCONTIN) 20 mg T12A 12 hr tablet Take 1 tablet (20 mg total) by mouth every 12 (twelve) hours. Qty: 60 tablet, Refills: 0    senna (SENOKOT) 8.6 MG TABS tablet Take 1 tablet (8.6 mg total) by mouth daily as needed for mild constipation. Qty: 120 each, Refills: 0      CONTINUE these medications which have NOT CHANGED   Details  LYRICA 75 MG capsule Take 150 mg by mouth 2 (two) times daily. Refills: 1    metoCLOPramide (REGLAN) 5 MG tablet Take 1 tablet by mouth 4 (four) times daily - after meals and at bedtime.    tiZANidine (ZANAFLEX) 4 MG tablet Take 4 mg by mouth at bedtime.      STOP taking these medications     Multiple Vitamin (MULTIVITAMIN WITH MINERALS) TABS tablet        Allergies  Allergen Reactions  .  Aspirin Other (See Comments)    "sets stomach on fire"  . Codeine   . Cyclobenzaprine Hcl       The results of significant diagnostics from this hospitalization (including imaging, microbiology, ancillary and laboratory) are listed below for reference.    Significant Diagnostic Studies: US Abdomen Complete  02/06/2015   CLINICAL DATA:  Hepatitis C, elevated AFP and CA 19-9, multiple liver masses status post recent liver biopsy, jaundice, bile duct obstruction  EXAM: ULTRASOUND ABDOMEN COMPLETE  COMPARISON:  CT abdomen pelvis dated 02/03/2015  FINDINGS: Gallbladder: Gallbladder wall thickening/ edema. No gallstones. Negative sonographic Murphy's sign.  Common bile duct: Diameter: 5 mm. No intrahepatic ductal  dilatation.  Liver: Multiple hepatic masses, measuring up to 8.3 cm, better evaluated on CT. Main portal vein not visualized/possibly occluded.  IVC: Poorly visualized with mass effect from the liver.  Pancreas: Not discretely visualized.  Spleen: Size and appearance within normal limits.  Right Kidney: Length: 11.7 cm.  No mass or hydronephrosis.  Left Kidney: Length: 13.3 cm.  No mass or hydronephrosis.  Abdominal aorta: No aneurysm visualized.  Other findings: Mild perihepatic ascites.  IMPRESSION: Multiple hepatic masses, measuring up to 3.3 cm, better evaluated on CT.  Mild gallbladder wall thickening/edema, likely secondary to perihepatic ascites.  Main portal vein is not visualized/possibly excluded.   Electronically Signed   By: Julian Hy M.D.   On: 02/06/2015 10:05   Ct Abdomen Pelvis W Contrast  02/03/2015   CLINICAL DATA:  Diffuse abdominal pain for 1 month. Abnormal liver function tests.  EXAM: CT ABDOMEN AND PELVIS WITH CONTRAST  TECHNIQUE: Multidetector CT imaging of the abdomen and pelvis was performed using the standard protocol following bolus administration of intravenous contrast.  CONTRAST:  50 mL OMNIPAQUE IOHEXOL 300 MG/ML SOLN, 100 mL OMNIPAQUE IOHEXOL 300 MG/ML SOLN  COMPARISON:  CT abdomen and pelvis 05/04/2011.  FINDINGS: Lung bases are clear.  No pleural or pericardial effusion.  Innumerable mass lesions are seen throughout the liver. The largest is in the right hepatic lobe measuring 7.4 x 8.4 cm in the axial plane on image 36. The liver has a nodular border and is shrunken. No flow is identified within a markedly dilated portal vein likely due to tumor invasion extending to near the confluence of the splenic and portal veins. Although somewhat poorly seen, there appears to be biliary ductal dilatation. Splenic and gastric varices are noted.  The adrenal glands are unremarkable. The spleen is mildly enlarged measuring 13.3 cm. Although the pancreas is somewhat obscured by  likely tumor within the portal vein, the pancreas appears normal. No pancreatic ductal dilatation is identified. There is aortoiliac atherosclerosis without aneurysm. A small volume of abdominal and pelvic ascites is noted. The stomach and small and large bowel appear normal.  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 The bile ducts are poorly seen but likely dilated which could be due to tumor invasion or compression of the common bile duct by tumor.  Aortoiliac atherosclerosis without aneurysm.   Electronically Signed   By: Inge Rise M.D.   On: 02/03/2015 19:33   US Biopsy  02/04/2015   CLINICAL DATA:  59 year old with jaundice and multiple liver lesions. Tissue diagnosis is needed.  EXAM: ULTRASOUND-GUIDED LIVER LESION BIOPSY  Physician: Stephan Minister. Anselm Pancoast, MD  FLUOROSCOPY TIME:  None  MEDICATIONS: 2 mg Versed, 50 mcg fentanyl. A radiology  nurse monitored the patient for moderate sedation.  ANESTHESIA/SEDATION: Moderate sedation time: 18 minutes  PROCEDURE: The procedure was explained to the patient. The risks and benefits of the procedure were discussed and the patient's questions were addressed. Informed consent was obtained from the patient. The liver was evaluated with ultrasound. Lesion along the inferior left hepatic lobe was selected for biopsy. The anterior abdomen was prepped and draped in sterile fashion. Skin was anesthetized with 1% lidocaine. 97 gauge needle directed into the left hepatic lesion with ultrasound guidance. Needle position confirmed within the lesion. Three core biopsies were obtained with an 18 gauge core device. Gel-Foam plugs was injected through the 17 gauge needle for hemostasis. Bandage placed over the puncture site.  FINDINGS: Multiple solid hepatic lesions. Small amount of ascites along the right hepatic lobe. Core biopsies obtained  from a left inferior hepatic lesion.  Estimated blood loss: Minimal  COMPLICATIONS: None  IMPRESSION: Ultrasound-guided core biopsies of a left hepatic lesion.   Electronically Signed   By: Markus Daft M.D.   On: 02/04/2015 16:55    Microbiology: Recent Results (from the past 240 hour(s))  Urine culture     Status: None   Collection Time: 02/03/15  3:28 PM  Result Value Ref Range Status   Specimen Description URINE, CLEAN CATCH  Final   Special Requests NONE  Final   Culture   Final    NO GROWTH 2 DAYS Performed at [facility]    Report Status 02/05/2015 FINAL  Final     Labs: Basic Metabolic Panel:  Recent Labs Lab 02/03/15 1649  NA 134*  K 3.7  CL 99*  CO2 22  GLUCOSE 110*  BUN 10  CREATININE 0.55*  CALCIUM 8.7*   Liver Function Tests:  Recent Labs Lab 02/03/15 1649  AST 629*  ALT 95*  ALKPHOS 949*  BILITOT 12.9*  PROT 7.6  ALBUMIN 2.7*    Recent Labs Lab 02/03/15 1649  LIPASE 27   No results for input(s): AMMONIA in the last 168 hours. CBC:  Recent Labs Lab 02/03/15 1649  WBC 11.0*  NEUTROABS 8.4*  HGB 11.7*  HCT 36.2*  MCV 97.6  PLT 393   Cardiac Enzymes: No results for input(s): CKTOTAL, CKMB, CKMBINDEX, TROPONINI in the last 168 hours. BNP: BNP (last 3 results) No results for input(s): BNP in the last 8760 hours.  ProBNP (last 3 results) No results for input(s): PROBNP in the last 8760 hours.  CBG: No results for input(s): GLUCAP in the last 168 hours.     Signed:  Velvet Bathe  [facility] 02/06/2015, 4:10 PM

## 2015-02-06 NOTE — Care Management (Signed)
Important Message  Patient Details  Name: Samuel Massey MRN: [id number] Date of Birth: [birth year]   Medicare Important Message Given:  Yes-second notification given    Shelda Altes, LCSW 02/06/2015, 2:51 PM

## 2015-02-06 NOTE — Progress Notes (Signed)
    Progress Note   Subjective  feels a little better today. He ate breakfast.    Objective   Vital signs in last 24 hours: Temp:  [98.8 F (37.1 C)-100 F (37.8 C)] 98.8 F (37.1 C) (06/30 0459) Pulse Rate:  [93-99] 93 (06/30 0459) Resp:  [16-18] 16 (06/30 0459) BP: (124-142)/(75-87) 124/75 mmHg (06/30 0459) SpO2:  [92 %-95 %] 92 % (06/30 0459) Last BM Date: 02/03/15 General:    Pleasant white male in NAD Abdomen:  Soft, nontender  Normal bowel sounds. Neurologic:  Alert and oriented,  grossly normal neurologically. Psych:  Cooperative. Normal mood and affect.    Assessment / Plan:     59 year old male with newly diagnosed [place]. I spoke with with Dr. Benay Spice (Oncology).  If abdominal pain manageable with pain meds patient and Hospitalist has no other concerns ,patient can be discharged home.  Dr. Benay Spice will see him in the office in 1-2 weeks (his office will call patient). I spoke with patient about [facility] diagnosis and plans for outpatient Onc appointment and he is agreeable.    LOS: 3 days   Samuel Massey  02/06/2015, 9:43 AM

## 2015-02-06 NOTE — Telephone Encounter (Signed)
Spoke with patient and gave np appt for 07/07 @ 1:30 w/Dr. Benay Spice

## 2015-02-07 ENCOUNTER — Telehealth: Payer: Self-pay | Admitting: *Deleted

## 2015-02-07 NOTE — Telephone Encounter (Signed)
Pt called reports "I just got out of the hospital and my feet are swollen up so big I can't walk on them; I don't know if this is related to my cancer; can Dr. Benay Spice call me something in?"  Returned call to pt explaining that Dr. Benay Spice is out of the office today and his New Patient Apt is scheduled for 02/13/15; pt needs to call PCP for evaluation of feet swelling.  Pt verbalized understanding and confirmed he will be here 02/13/15.

## 2015-02-12 ENCOUNTER — Encounter: Payer: Self-pay | Admitting: *Deleted

## 2015-02-12 DIAGNOSIS — C220 Liver cell carcinoma: Secondary | ICD-10-CM | POA: Insufficient documentation

## 2015-02-12 NOTE — ED Provider Notes (Signed)
CSN: [id number]     Arrival date & time 02/03/15  1528 History   First MD Initiated Contact with Patient 02/03/15 1651     Chief Complaint  Patient presents with  . sent by PCP   . Jaundice  . abnormal labs     (Consider location/radiation/quality/duration/timing/severity/associated sxs/prior Treatment) HPI Patient presents to the emergency department with yellowing of the skin and abdominal swelling and discomfort.  The patient states this started several months ago was gotten worse.  He states he was seen by his primary care doctor who referred him here for abnormal LFTs.  Patient states that he has not been a heavy drinker for many years.  He states that he stopped drinking heavily about 20 years ago.  The patient states that he is a smoker.  Patient denies chest pain, shortness of breath, weakness, dizziness, headache, blurred vision, back pain, neck pain, fever, dysuria, incontinence, or syncope.  The patient states that he has had some nausea, no vomiting.  He states she has had decreased appetite and significant weight loss over the last few months Past Medical History  Diagnosis Date  . Hypertension   . Back pain, chronic   . Restless leg syndrome   . Colitis    Past Surgical History  Procedure Laterality Date  . Knee surgery Left   . Rotator cuff repair Left   . Laminectomy    . Lumbar disc surgery     Family History  Problem Relation Age of Onset  . Diabetes Mother   . Hypertension Mother   . Hypertension Father    History  Substance Use Topics  . Smoking status: [place] Every Day Smoker -- 1.00 packs/day for 42 years    Types: Cigarettes  . Smokeless tobacco: Not on file  . Alcohol Use: No    Review of Systems All other systems negative except as documented in the HPI. All pertinent positives and negatives as reviewed in the HPI.   Allergies  Aspirin; Codeine; and Cyclobenzaprine hcl  Home Medications   Prior to Admission medications   Medication Sig  Start Date End Date Taking? Authorizing Provider  LYRICA 75 MG capsule Take 150 mg by mouth 2 (two) times daily. 01/16/15  Yes Historical Provider, MD  metoCLOPramide (REGLAN) 5 MG tablet Take 1 tablet by mouth 4 (four) times daily - after meals and at bedtime. 02/03/15  Yes Historical Provider, MD  tiZANidine (ZANAFLEX) 4 MG tablet Take 4 mg by mouth at bedtime. 11/18/14  Yes Historical Provider, MD  oxyCODONE (OXY IR/ROXICODONE) 5 MG immediate release tablet Take 1 tablet (5 mg total) by mouth every 4 (four) hours as needed for breakthrough pain. 02/06/15   Velvet Bathe, MD  OxyCODONE (OXYCONTIN) 20 mg T12A 12 hr tablet Take 1 tablet (20 mg total) by mouth every 12 (twelve) hours. 02/06/15   Velvet Bathe, MD  senna (SENOKOT) 8.6 MG TABS tablet Take 1 tablet (8.6 mg total) by mouth daily as needed for mild constipation. 02/06/15   Velvet Bathe, MD   BP 123/75 mmHg  Pulse 98  Temp(Src) 98.4 F (36.9 C) (Oral)  Resp 15  Ht 6' (1.829 m)  Wt 190 lb (86.183 kg)  BMI 25.76 kg/m2  SpO2 93% Physical Exam  Constitutional: He is oriented to person, place, and time. He appears well-developed and well-nourished. No distress.  HENT:  Head: Normocephalic and atraumatic.  Mouth/Throat: Oropharynx is clear and moist.  Eyes: Pupils are equal, round, and reactive to light. Scleral icterus  is present.  Neck: Normal range of motion. Neck supple.  Cardiovascular: Normal rate, regular rhythm and normal heart sounds.  Exam reveals no gallop and no friction rub.   No murmur heard. Pulmonary/Chest: Effort normal.  Abdominal: Normal appearance and bowel sounds are normal. He exhibits distension and mass. There is hepatomegaly. There is tenderness in the right upper quadrant and epigastric area. There is guarding.  Musculoskeletal: He exhibits edema.  Neurological: He is alert and oriented to person, place, and time. He exhibits normal muscle tone. Coordination normal.  Skin: Skin is warm and dry. No rash noted. No  erythema.  Patient has jaundice of the skin  Psychiatric: He has a normal mood and affect. His behavior is normal.  Nursing note and vitals reviewed.   ED Course  Procedures (including critical care time) Labs Review Labs Reviewed  CBC WITH DIFFERENTIAL/PLATELET - Abnormal; Notable for the following:    WBC 11.0 (*)    RBC 3.71 (*)    Hemoglobin 11.7 (*)    HCT 36.2 (*)    RDW 18.7 (*)    Neutro Abs 8.4 (*)    All other components within normal limits  COMPREHENSIVE METABOLIC PANEL - Abnormal; Notable for the following:    Sodium 134 (*)    Chloride 99 (*)    Glucose, Bld 110 (*)    Creatinine, Ser 0.55 (*)    Calcium 8.7 (*)    Albumin 2.7 (*)    AST 629 (*)    ALT 95 (*)    Alkaline Phosphatase 949 (*)    Total Bilirubin 12.9 (*)    All other components within normal limits  URINALYSIS, ROUTINE W REFLEX MICROSCOPIC (NOT AT [facility]) - Abnormal; Notable for the following:    Color, Urine ORANGE (*)    APPearance CLOUDY (*)    Bilirubin Urine LARGE (*)    Protein, ur 30 (*)    Nitrite POSITIVE (*)    Leukocytes, UA SMALL (*)    All other components within normal limits  URINE MICROSCOPIC-ADD ON - Abnormal; Notable for the following:    Bacteria, UA FEW (*)    Casts HYALINE CASTS (*)    All other components within normal limits  HEPATITIS C ANTIBODY - Abnormal; Notable for the following:    HCV Ab >11.0 (*)    All other components within normal limits  AFP TUMOR MARKER - Abnormal; Notable for the following:    AFP-Tumor Marker 5114.0 (*)    All other components within normal limits  CANCER ANTIGEN 19-9 - Abnormal; Notable for the following:    CA 19-9 161 (*)    All other components within normal limits  URINE CULTURE  LIPASE, BLOOD  HEPATITIS B CORE ANTIBODY, IGM  HEPATITIS B SURFACE ANTIGEN  HEPATITIS B SURFACE ANTIBODY  HEPATITIS B E ANTIBODY  HEPATITIS B E ANTIGEN  PROTIME-INR  SURGICAL PATHOLOGY    Patient will be admitted to the hospital for further  evaluation of these significant abnormalities noted on CT scan.  I did advise the patient.  This is most likely cancer and answered all questions.  The patient agrees the plan.  He is advised that the hospitalist will be in to see him for admission    Dalia Heading, PA-C 02/14/15 [place], MD 02/14/15 [id number]

## 2015-02-13 ENCOUNTER — Ambulatory Visit: Payer: Medicare Other

## 2015-02-13 ENCOUNTER — Ambulatory Visit (HOSPITAL_BASED_OUTPATIENT_CLINIC_OR_DEPARTMENT_OTHER): Payer: Medicare Other | Admitting: Oncology

## 2015-02-13 ENCOUNTER — Telehealth: Payer: Self-pay | Admitting: Oncology

## 2015-02-13 ENCOUNTER — Encounter: Payer: Self-pay | Admitting: Oncology

## 2015-02-13 VITALS — BP 133/84 | HR 99 | Temp 97.7°F | Resp 19 | Ht 72.0 in | Wt 202.9 lb

## 2015-02-13 DIAGNOSIS — R160 Hepatomegaly, not elsewhere classified: Secondary | ICD-10-CM | POA: Diagnosis not present

## 2015-02-13 DIAGNOSIS — R609 Edema, unspecified: Secondary | ICD-10-CM | POA: Diagnosis not present

## 2015-02-13 DIAGNOSIS — K7460 Unspecified cirrhosis of liver: Secondary | ICD-10-CM

## 2015-02-13 DIAGNOSIS — C220 Liver cell carcinoma: Secondary | ICD-10-CM

## 2015-02-13 MED ORDER — METOCLOPRAMIDE HCL 10 MG PO TABS: 5.0000 mg | ORAL_TABLET | Freq: Four times a day (QID) | ORAL | Status: AC

## 2015-02-13 MED ORDER — OXYCODONE HCL 5 MG PO TABS: 5.0000 mg | ORAL_TABLET | ORAL | Status: AC | PRN

## 2015-02-13 MED ORDER — FUROSEMIDE 20 MG PO TABS: 20.0000 mg | ORAL_TABLET | Freq: Every day | ORAL | Status: DC

## 2015-02-13 NOTE — [facility] Oncology Navigator Note (Signed)
Oncology Nurse Navigator Documentation  Oncology Nurse Navigator Flowsheets 02/13/2015  Referral date to RadOnc/MedOnc 02/06/2015  Navigator Encounter Type Initial MedOnc  Barriers/Navigation Needs Education-Liver Cancer, Pain management,Edema   Met with patient  during new patient visit. Explained the role of the GI Nurse Navigator and provided New Patient Packet with information on: 1. Liver cancer 2. Support groups 3. Advanced Directives 4. Fall Safety Plan and discussed this in regards to his home Answered questions, reviewed [place] treatment plan using TEACH back and provided emotional support. Provided copy of [place] treatment plan, which is comfort care. Referral to Hospice per MD. Patient wants to be DNR. His main goal is to be out of pain and to try to stay in his home. Allowed him to express his sadness of situation and admits he can't see God in any of this. Told him sometimes it's in the hard times that we find God.   Merceda Elks, RN, BSN GI Oncology [facility]

## 2015-02-13 NOTE — Progress Notes (Signed)
Checked in new pt with no financial concerns. °

## 2015-02-13 NOTE — Progress Notes (Signed)
[facility] Patient Consult   Referring MD: Errin Chewning A Deleeuw 59 y.o.  May 03, 1956    Reason for Referral: Hepatocellular carcinoma   HPI: Mr. Wiemann reports a history of abdominal pain and constipation for several months. He was evaluated by his primary physician and referred to the emergency room after he was noted to have jaundice and abnormal liver enzymes. A CT of the abdomen 02/03/2015 revealed clear lung bases. Innumerable mass lesions are noted in the liver with cirrhotic changes. No flow was identified in a markedly dilated portal vein felt to be secondary to tumor invasion extending to the confluence of the splenic and portal veins. Splenic and gastric varices were noted. The spleen is mildly enlarged. The bile ducts were dilated.  He was seen in consultation by gastroenterology. An abdominal ultrasound on 02/06/2015 revealed multiple hepatic masses. The common bile duct measured 5 mm and there was no intrahepatic ductal dilatation. The main portal vein was not visualized.  An ultrasound-guided biopsy of a left hepatic lesion on 02/04/2015 ([id number]) confirmed hepatocellular carcinoma with necrosis.  He reports improvement in the abdominal pain with oxycodone. OxyContin caused an altered mental status.  Past Medical History  Diagnosis Date  . Hypertension   . Back pain, chronic   . Restless leg syndrome   . Colitis     .  Hepatitis C positive                                                                                 02/03/2015  Past Surgical History  Procedure Laterality Date  . Knee surgery Left   . Rotator cuff repair Left   . Laminectomy    . Lumbar disc surgery      Medications: Reviewed  Allergies:  Allergies  Allergen Reactions  . Aspirin Other (See Comments)    "sets stomach on fire"  . Codeine   . Cyclobenzaprine Hcl     Family history: No family history of cancer  Social History:   He lives with his  brother in [place]. He is disabled secondary to spine disease. He previous he worked in Mining engineer. He smokes cigarettes. He quit drinking alcohol 30 years ago. No known risk factor for HIV or hepatitis.  History  Alcohol Use No    History  Smoking status  . [place] Every Day Smoker -- 1.00 packs/day for 42 years  . Types: Cigarettes  Smokeless tobacco  . Not on file      ROS:   Positives include: Abdominal pain, constipation relieved with Senokot, postprandial nausea, jaundice, generalized weakness  A complete ROS was otherwise negative.  Physical Exam:  Blood pressure 133/84, pulse 99, temperature 97.7 F (36.5 C), temperature source Oral, resp. rate 19, height 6' (1.829 m), weight 202 lb 14.4 oz (92.035 kg), SpO2 99 %.  HEENT: Scleral icterus, oropharynx without visible mass, neck without mass Lungs: Clear bilaterally Cardiac: Regular rate and rhythm Abdomen: The liver is markedly enlarged extending below the umbilicus into the left of midline with associated tenderness. GU: Testes without mass  Vascular: 2+ pitting edema at the low leg bilaterally, trace pitting edema at the thighs and  abdominal wall Lymph nodes: No cervical, supra-clavicular, axillary, or inguinal nodes Neurologic: Alert and oriented, the motor she appears intact in the upper and lower extremities Skin: Spider telangiectasias at the upper chest and back, jaundice Musculoskeletal: No spine tenderness   LAB:  CBC  Lab Results  Component Value Date   WBC 11.0* 02/03/2015   HGB 11.7* 02/03/2015   HCT 36.2* 02/03/2015   MCV 97.6 02/03/2015   PLT 393 02/03/2015   NEUTROABS 8.4* 02/03/2015     CMP      Component Value Date/Time   NA 134* 02/03/2015 1649   K 3.7 02/03/2015 1649   CL 99* 02/03/2015 1649   CO2 22 02/03/2015 1649   GLUCOSE 110* 02/03/2015 1649   BUN 10 02/03/2015 1649   CREATININE 0.55* 02/03/2015 1649   CALCIUM 8.7* 02/03/2015 1649   PROT 7.6 02/03/2015 1649   ALBUMIN  2.7* 02/03/2015 1649   AST 629* 02/03/2015 1649   ALT 95* 02/03/2015 1649   ALKPHOS 949* 02/03/2015 1649   BILITOT 12.9* 02/03/2015 1649   GFRNONAA >60 02/03/2015 1649   GFRAA >60 02/03/2015 1649   02/05/2015: [id number]   Imaging:  CT from 02/03/2015 reviewed   Assessment/Plan:   1. Multifocal hepatocellular carcinoma, status post an ultrasound had a biopsy of a left liver lesion 6 10/14/2014 confirming hepatocellular carcinoma  2. Cirrhosis  3.   Hepatitis C antibody positive  4.   Pain secondary to #1   Disposition:   Mr. Mcclintock has cirrhosis. He has been diagnosed with multifocal hepatocellular carcinoma. He has extensive tumor involvement of the liver with associated pain and hepatomegaly.  He is not a candidate for surgery or hepatic directed therapy. He is not a candidate for sorafenib secondary to the markedly elevated bilirubin. His case was presented at the GI tumor conference on 02/12/2015. There is no option for embolization or radio embolization therapy secondary to the portal vein occlusion.  I discussed the prognosis with Mr. Auxier. I recommend comfort care. He agrees to a [facility] referral.  We discussed CPR and ACLS issues. He will be placed on a no CODE BLUE status.  We refilled a prescription for oxycodone. He will begin a trial of Lasix for the edema.  Mr. Hyland will return for an office visit in 2 weeks.  Approximately 40 minutes were spent with the patient today. The majority of the time was used for counseling and coordination of care.  Aubry Tucholski 02/13/2015, 6:47 PM

## 2015-02-13 NOTE — Telephone Encounter (Signed)
Pt confirmed labs/ov per 07/07 POF, gave pt AVS and Calendar.... KJ °

## 2015-02-14 ENCOUNTER — Telehealth: Payer: Self-pay | Admitting: *Deleted

## 2015-02-14 NOTE — Telephone Encounter (Signed)
Per Dr. Benay Spice; faxed referral to [facility]

## 2015-02-17 ENCOUNTER — Telehealth: Payer: Self-pay

## 2015-02-17 ENCOUNTER — Telehealth: Payer: Self-pay | Admitting: *Deleted

## 2015-02-17 ENCOUNTER — Other Ambulatory Visit: Payer: Self-pay

## 2015-02-17 DIAGNOSIS — C220 Liver cell carcinoma: Secondary | ICD-10-CM

## 2015-02-17 MED ORDER — FUROSEMIDE 20 MG PO TABS: 40.0000 mg | ORAL_TABLET | Freq: Every day | ORAL | Status: AC

## 2015-02-17 NOTE — Telephone Encounter (Signed)
VM message received @ 12:51 pm from the admissions RN requesting verbal order for Hospice MD to sign DNR form for patient.   Call back to RN and gave her verbal order for DNR as pt had discussed with Dr. Benay Spice at last office visit. 02/13/15

## 2015-02-17 NOTE — Telephone Encounter (Signed)
MRCP at [facility] on 02/25/15 arrive 12:45. NPO after 9:00 am

## 2015-02-17 NOTE — Telephone Encounter (Signed)
Patient is notified.

## 2015-02-17 NOTE — Telephone Encounter (Signed)
Call from pt requesting a different med for BLE edema. Reports feet are so puffy he can "barely walk." Reviewed with Dr. Benay Spice: Order received to increase Lasix to 40 mg daily. Pt voiced understanding. Discussed elevating legs, compression stockings. Pt reports he has some in the home, will find them and try them.

## 2015-02-17 NOTE — Telephone Encounter (Signed)
-----   Message from Inda Castle, MD sent at 02/17/2015  8:47 AM EDT ----- Leroy Sea, I think it's best to proceed with MRCP to see if there are major areas of obstruction which we could stent.  I will order this. Samuel Massey ----- Message -----    From: Ladell Pier, MD    Sent: 02/13/2015   7:05 PM      To: Inda Castle, MD  Patient with multifocal [facility] you saw during a recent admission.  Marked hyperbilirubinemia. There was a question of bile duct dilatation on the admission CT, but the bile ducts were not dilated on an abdominal ultrasound.  He was presented at conference 02/12/2015 and is not a candidate for surgery or hepatic directed therapy. Is there any option for biliary stenting?  We cannot deliver systemic therapy with the high bilirubin  Thanks,  Leroy Sea

## 2015-02-17 NOTE — Telephone Encounter (Signed)
I have left message for the patient to call back 

## 2015-02-19 ENCOUNTER — Emergency Department (HOSPITAL_COMMUNITY)
Admission: EM | Admit: 2015-02-19 | Discharge: 2015-02-19 | Disposition: A | Discharge status: Home / Self Care | Attending: Emergency Medicine | Admitting: Emergency Medicine

## 2015-02-19 ENCOUNTER — Emergency Department (HOSPITAL_COMMUNITY)

## 2015-02-19 ENCOUNTER — Encounter (HOSPITAL_COMMUNITY): Payer: Self-pay | Admitting: Emergency Medicine

## 2015-02-19 DIAGNOSIS — R140 Abdominal distension (gaseous): Secondary | ICD-10-CM | POA: Insufficient documentation

## 2015-02-19 DIAGNOSIS — R1010 Upper abdominal pain, unspecified: Secondary | ICD-10-CM

## 2015-02-19 DIAGNOSIS — Z79899 Other long term (current) drug therapy: Secondary | ICD-10-CM | POA: Insufficient documentation

## 2015-02-19 DIAGNOSIS — R600 Localized edema: Secondary | ICD-10-CM | POA: Diagnosis not present

## 2015-02-19 DIAGNOSIS — Z720 Tobacco use: Secondary | ICD-10-CM | POA: Diagnosis not present

## 2015-02-19 DIAGNOSIS — G8929 Other chronic pain: Secondary | ICD-10-CM | POA: Insufficient documentation

## 2015-02-19 DIAGNOSIS — I10 Essential (primary) hypertension: Secondary | ICD-10-CM | POA: Insufficient documentation

## 2015-02-19 DIAGNOSIS — C220 Liver cell carcinoma: Secondary | ICD-10-CM | POA: Diagnosis not present

## 2015-02-19 DIAGNOSIS — Z8619 Personal history of other infectious and parasitic diseases: Secondary | ICD-10-CM | POA: Insufficient documentation

## 2015-02-19 DIAGNOSIS — R110 Nausea: Secondary | ICD-10-CM | POA: Insufficient documentation

## 2015-02-19 DIAGNOSIS — R1011 Right upper quadrant pain: Secondary | ICD-10-CM | POA: Diagnosis present

## 2015-02-19 HISTORY — DX: Malignant (primary) neoplasm, unspecified: C80.1

## 2015-02-19 HISTORY — DX: Unspecified viral hepatitis C without hepatic coma: B19.20

## 2015-02-19 LAB — CBC WITH DIFFERENTIAL/PLATELET
BASOS ABS: 0 10*3/uL (ref 0.0–0.1)
Basophils Relative: 0 % (ref 0–1)
Eosinophils Absolute: 0 10*3/uL (ref 0.0–0.7)
Eosinophils Relative: 0 % (ref 0–5)
HEMATOCRIT: 33.8 % — AB (ref 39.0–52.0)
Hemoglobin: 11.1 g/dL — ABNORMAL LOW (ref 13.0–17.0)
LYMPHS PCT: 12 % (ref 12–46)
Lymphs Abs: 1.6 10*3/uL (ref 0.7–4.0)
MCH: 30.9 pg (ref 26.0–34.0)
MCHC: 32.8 g/dL (ref 30.0–36.0)
MCV: 94.2 fL (ref 78.0–100.0)
MONOS PCT: 7 % (ref 3–12)
Monocytes Absolute: 1 10*3/uL (ref 0.1–1.0)
NEUTROS ABS: 11.2 10*3/uL — AB (ref 1.7–7.7)
Neutrophils Relative %: 81 % — ABNORMAL HIGH (ref 43–77)
PLATELETS: 369 10*3/uL (ref 150–400)
RBC: 3.59 MIL/uL — AB (ref 4.22–5.81)
RDW: 19.5 % — AB (ref 11.5–15.5)
WBC: 13.8 10*3/uL — AB (ref 4.0–10.5)

## 2015-02-19 LAB — COMPREHENSIVE METABOLIC PANEL
ALT: 70 U/L — AB (ref 17–63)
ANION GAP: 12 (ref 5–15)
AST: 729 U/L — ABNORMAL HIGH (ref 15–41)
Albumin: 2.2 g/dL — ABNORMAL LOW (ref 3.5–5.0)
Alkaline Phosphatase: 1052 U/L — ABNORMAL HIGH (ref 38–126)
BILIRUBIN TOTAL: 18.6 mg/dL — AB (ref 0.3–1.2)
BUN: 11 mg/dL (ref 6–20)
CALCIUM: 9.4 mg/dL (ref 8.9–10.3)
CHLORIDE: 99 mmol/L — AB (ref 101–111)
CO2: 22 mmol/L (ref 22–32)
CREATININE: 0.7 mg/dL (ref 0.61–1.24)
GFR calc Af Amer: >60 mL/min (ref >60)
GFR calc non Af Amer: >60 mL/min (ref >60)
Glucose, Bld: 81 mg/dL (ref 65–99)
POTASSIUM: 3 mmol/L — AB (ref 3.5–5.1)
Sodium: 133 mmol/L — ABNORMAL LOW (ref 135–145)
TOTAL PROTEIN: 6.9 g/dL (ref 6.5–8.1)

## 2015-02-19 LAB — I-STAT CG4 LACTIC ACID, ED: Lactic Acid, Venous: 2.3 mmol/L (ref 0.5–2.0)

## 2015-02-19 MED ORDER — SODIUM CHLORIDE 0.9 % IV BOLUS (SEPSIS)
500.0000 mL | Freq: Once | INTRAVENOUS | Status: AC
  Administered 2015-02-19: 500 mL via INTRAVENOUS

## 2015-02-19 MED ORDER — HYDROMORPHONE HCL 1 MG/ML IJ SOLN
0.5000 mg | Freq: Once | INTRAMUSCULAR | Status: AC
  Administered 2015-02-19: 0.5 mg via INTRAVENOUS
  Filled 2015-02-19: qty 1

## 2015-02-19 MED ORDER — HYDROMORPHONE HCL 1 MG/ML IJ SOLN
1.0000 mg | Freq: Once | INTRAMUSCULAR | Status: AC
  Administered 2015-02-19: 1 mg via INTRAVENOUS
  Filled 2015-02-19: qty 1

## 2015-02-19 MED ORDER — HYDROMORPHONE HCL 4 MG PO TABS: 4.0000 mg | ORAL_TABLET | ORAL | Status: AC | PRN

## 2015-02-19 MED ORDER — ONDANSETRON HCL 4 MG/2ML IJ SOLN
4.0000 mg | Freq: Once | INTRAMUSCULAR | Status: AC
Start: 2015-02-19 — End: 2015-02-19
  Administered 2015-02-19: 4 mg via INTRAVENOUS
  Filled 2015-02-19: qty 2

## 2015-02-19 NOTE — ED Notes (Signed)
Per EMS: Pt from home.  Lack of appetite for 2 days, NV.  Was only given 2-3 months to live within the last month.  Hospice will be here shortly.  Pt has hx of liver cancer and hep C.  Bilateral lower leg edema.  C/o pain in abd.  Very distended.

## 2015-02-19 NOTE — ED Notes (Signed)
Bed: WA20 Expected date:  Expected time:  Means of arrival:  Comments: EMS cancer/abd. pain

## 2015-02-19 NOTE — ED Notes (Signed)
Ultrasound tech at bedside

## 2015-02-19 NOTE — ED Notes (Signed)
MD at bedside. 

## 2015-02-19 NOTE — ED Provider Notes (Addendum)
CSN: [id number]     Arrival date & time 02/19/15  1761 History   First MD Initiated Contact with Patient 02/19/15 [phone]     Chief Complaint  Patient presents with  . Abdominal Pain     (Consider location/radiation/quality/duration/timing/severity/associated sxs/prior Treatment) Patient is a 59 y.o. male presenting with abdominal pain. The history is provided by the patient.  Abdominal Pain Pain location:  RUQ and LUQ Pain quality: aching, bloating, sharp and shooting   Pain radiates to:  Does not radiate Pain severity:  Severe Onset quality:  Gradual Duration:  2 days Timing:  Constant Progression:  Worsening Chronicity:  Recurrent Context comment:  Patient with a recent history diagnosis of end-stage hepatocellular carcinoma with 1-2 months to live and recently signed up for hospice but they're not coming to his home yet Relieved by:  Nothing Worsened by:  Nothing tried Ineffective treatments: Took 1 oxycodone last night patient does not seem to help. Associated symptoms: anorexia and nausea   Associated symptoms: no cough, no shortness of breath and no vomiting   Associated symptoms comment:  3 days ago had excessive bowel movements and urine which have seemed to decrease significantly. Mild nausea but no excessive vomiting.   Past Medical History  Diagnosis Date  . Hypertension   . Back pain, chronic   . Restless leg syndrome   . Colitis   . Cancer     liver  . Hepatitis C    Past Surgical History  Procedure Laterality Date  . Knee surgery Left   . Rotator cuff repair Left   . Laminectomy    . Lumbar disc surgery     Family History  Problem Relation Age of Onset  . Diabetes Mother   . Hypertension Mother   . Hypertension Father    History  Substance Use Topics  . Smoking status: [place] Every Day Smoker -- 0.50 packs/day for 42 years    Types: Cigarettes  . Smokeless tobacco: Not on file  . Alcohol Use: No    Review of Systems  Respiratory: Negative for  cough and shortness of breath.   Gastrointestinal: Positive for nausea, abdominal pain and anorexia. Negative for vomiting.  All other systems reviewed and are negative.     Allergies  Aspirin; Codeine; Cyclobenzaprine hcl; and Nsaids  Home Medications   Prior to Admission medications   Medication Sig Start Date End Date Taking? Authorizing Provider  furosemide (LASIX) 20 MG tablet Take 2 tablets (40 mg total) by mouth daily. 02/17/15   Ladell Pier, MD  LYRICA 75 MG capsule Take 150 mg by mouth 2 (two) times daily. 01/16/15   Historical Provider, MD  metoCLOPramide (REGLAN) 10 MG tablet Take 0.5 tablets (5 mg total) by mouth 4 (four) times daily. 02/13/15   Ladell Pier, MD  oxyCODONE (OXY IR/ROXICODONE) 5 MG immediate release tablet Take 1 tablet (5 mg total) by mouth every 3 (three) hours as needed for breakthrough pain. 1-2 tabs every 3 hrs as needed for breakthrough pain 02/13/15   Ladell Pier, MD  OxyCODONE (OXYCONTIN) 20 mg T12A 12 hr tablet Take 1 tablet (20 mg total) by mouth every 12 (twelve) hours. Patient not taking: Reported on 02/13/2015 02/06/15   Velvet Bathe, MD  senna (SENOKOT) 8.6 MG TABS tablet Take 1 tablet (8.6 mg total) by mouth daily as needed for mild constipation. 02/06/15   Velvet Bathe, MD  tiZANidine (ZANAFLEX) 4 MG tablet Take 4 mg by mouth at bedtime. 11/18/14  Historical Provider, MD   BP 126/75 mmHg  Pulse 95  Temp(Src) 98 F (36.7 C) (Oral)  Resp 16  Ht 6' (1.829 m)  Wt 202 lb (91.627 kg)  BMI 27.39 kg/m2  SpO2 95% Physical Exam  Constitutional: He is oriented to person, place, and time. He appears well-developed and well-nourished. No distress.  HENT:  Head: Normocephalic and atraumatic.  Mouth/Throat: Oropharynx is clear and moist.  Eyes: Conjunctivae and EOM are normal. Pupils are equal, round, and reactive to light. Scleral icterus is present.  Neck: Normal range of motion. Neck supple.  Cardiovascular: Normal rate, regular rhythm and intact  distal pulses.   No murmur heard. Pulmonary/Chest: Effort normal and breath sounds normal. No respiratory distress. He has no wheezes. He has no rales.  Abdominal: Soft. He exhibits distension and mass. There is hepatosplenomegaly. There is tenderness. There is guarding. There is no rebound.    Musculoskeletal: Normal range of motion. He exhibits edema. He exhibits no tenderness.  3-4+ pitting edema up to the knee bilaterally  Neurological: He is alert and oriented to person, place, and time.  Skin: Skin is warm and dry. No rash noted. No erythema.  Severe jaundice  Psychiatric: He has a normal mood and affect. His behavior is normal.  Nursing note and vitals reviewed.   ED Course  Procedures (including critical care time) Labs Review Labs Reviewed  CBC WITH DIFFERENTIAL/PLATELET - Abnormal; Notable for the following:    WBC 13.8 (*)    RBC 3.59 (*)    Hemoglobin 11.1 (*)    HCT 33.8 (*)    RDW 19.5 (*)    Neutrophils Relative % 81 (*)    Neutro Abs 11.2 (*)    All other components within normal limits  COMPREHENSIVE METABOLIC PANEL - Abnormal; Notable for the following:    Sodium 133 (*)    Potassium 3.0 (*)    Chloride 99 (*)    Albumin 2.2 (*)    AST 729 (*)    ALT 70 (*)    Alkaline Phosphatase 1052 (*)    Total Bilirubin 18.6 (*)    All other components within normal limits  I-STAT CG4 LACTIC ACID, ED - Abnormal; Notable for the following:    Lactic Acid, Venous 2.30 (*)    All other components within normal limits    Imaging Review Dg Abd 1 View  02/19/2015   CLINICAL DATA:  Abdominal distention and pain.  Liver cancer.  EXAM: ABDOMEN - 1 VIEW  COMPARISON:  Multiple exams, including 02/03/2015  FINDINGS: Somewhat centralized loops of bowel potentially reflecting ascites. No significant abnormal calcifications observed. No overtly dilated bowel.  IMPRESSION: 1. Centralized loops of bowel may reflect mild underlying ascites. No dilated bowel observed.   Electronically  Signed   By: Van Clines M.D.   On: 02/19/2015 09:49   US Abdomen Limited  02/19/2015   CLINICAL DATA:  History of hepatocellular carcinoma. Evaluate right upper quadrant pain.  EXAM: US ABDOMEN LIMITED - RIGHT UPPER QUADRANT  COMPARISON:  02/06/2015 and 02/03/2015  FINDINGS: Gallbladder:  The gallbladder is decompressed and the gallbladder wall roughly measures 4 mm. Gallbladder wall thickness is unchanged from the recent comparison examination. Negative for gallstones. Gallbladder is compressed by an adjacent liver lesion.  Common bile duct:  Diameter: 4 mm  Liver:  There appears to be slightly increased perihepatic ascites. Liver tissue is diffusely heterogeneous and again noted are multiple liver lesions. Dominant lesion in the right lobe measures up  to 9.2 cm. No significant flow in the main portal vein which is compatible with the previous CT.  IMPRESSION: There appears to be slightly increased perihepatic ascites.  Multiple liver lesions which are similar to the recent comparison examinations and compatible with diagnosis of hepatocellular carcinoma.  Gallbladder is decompressed without gallstones. Mild gallbladder wall thickening is unchanged and probably related to the perihepatic ascites.  No significant flow in the portal vein and compatible with portal vein occlusion.   Electronically Signed   By: Markus Daft M.D.   On: 02/19/2015 11:27     EKG Interpretation None      MDM   Final diagnoses:  Hepatocellular carcinoma  Pain of upper abdomen    Patient with a recent diagnosis of terminal end-stage hepatocellular carcinoma within the last few weeks who has filled out the paperwork for hospice but they have not started coming to his home who presents today with worsening upper abdominal pain. He has not been eating but denies excessive vomiting. Patient is severely jaundiced and has hepatosplenomegaly with a baseball sized mass that is very tender in the right upper quadrant. Patient  denies fever but appears dehydrated.  He is only taking one or half of an oxycodone irregularly as he is scared of what might happen if he takes too many.   The patient is a DNR/DNI and seems to understand the severity of his prognosis however he is looking for relief.  Will get an ultrasound and a KUB to evaluate if there is worsening of symptoms that may benefit from palliative procedures.  Patient given IV pain control.  11:36 AM Labs and imaging are basically unchanged from prior except for worsening jaundice. Ultrasound shows persistent compression of the gallbladder by adjacent liver lesions. At this time do not feel that there is any further intervention that can be done. Patient's pain was controlled with Dilaudid. Encourage patient tofollow up with hospice and d/ced with dilaudid as he feels that it helps more than oxycodone.  Blanchie Dessert, MD 02/19/15 [place], MD 02/19/15 1209

## 2015-02-19 NOTE — ED Notes (Signed)
Pt comes in today with EMS with a c/o abdominal pain. Pt has been recently diagnosed with Liver Cancer. Pt states that he was going to contact his Hospice Nurse this morning, but the pain became unbearable so he decided to come to the ED to be evaluated.

## 2015-02-21 ENCOUNTER — Telehealth: Payer: Self-pay | Admitting: *Deleted

## 2015-02-21 NOTE — Telephone Encounter (Signed)
Oncology Nurse Navigator Documentation  Oncology Nurse Navigator Flowsheets 02/21/2015  Referral date to RadOnc/MedOnc -  Navigator Encounter Type Telephone; 1 week F/U  Barriers/Navigation Needs Education-constipation, pain management,need for paracentesis?  Having some abdominal pain that is controlled with pain meds. Bowels are moving "OK". Suggested he try to be sure stool is soft and he moves every other day-constipation could increase his pain. Also suggested he have Hospice RN (saw him 7/14) to check his abdomen at next visit. May need paracentesis. Says swelling in his feet is slightly improved. Reminded him to call Hospice over weekend for any problems-they always have nurse on call.

## 2015-02-25 ENCOUNTER — Telehealth: Payer: Self-pay | Admitting: *Deleted

## 2015-02-25 ENCOUNTER — Ambulatory Visit (HOSPITAL_COMMUNITY): Admission: RE | Admit: 2015-02-25 | Payer: Medicare Other | Source: Ambulatory Visit

## 2015-02-25 NOTE — Telephone Encounter (Signed)
Received call from [facility] RN stating pt had skin breakdown on buttocks/inner thigh; pt states he feels he was bit by something and is requesting an antibiotic. Per Dr Benay Spice, pt needs to keep 7/21 appt so skin can be evaluated before anything is ordered. RN voiced understanding and will relay information to pt. Pt is currently at [facility] and will be discharged tomorrow, tentatively.

## 2015-02-26 ENCOUNTER — Telehealth: Payer: Self-pay | Admitting: *Deleted

## 2015-02-26 NOTE — Telephone Encounter (Signed)
Ok, I will see him at [facility] next week

## 2015-02-26 NOTE — Telephone Encounter (Signed)
VM message received from [facility].  She called to inform us that pt is currently @ [facility] for symptom management and is declining overall. He will not be here for appt with Dr. Benay Spice on 02/27/15. Appt. cancelled

## 2015-02-27 ENCOUNTER — Ambulatory Visit: Payer: Medicare Other | Admitting: Nurse Practitioner

## 2015-03-05 ENCOUNTER — Telehealth: Payer: Self-pay | Admitting: Oncology

## 2015-03-05 NOTE — Telephone Encounter (Signed)
Received death certificate on patient

## 2015-03-10 DEATH — deceased

## 2015-11-18 ENCOUNTER — Encounter: Payer: Self-pay | Admitting: Physical Medicine & Rehabilitation
